# Patient Record
Sex: Female | Born: 1950 | Race: White | Hispanic: No | Marital: Married | Smoking: Never smoker
Health system: Southern US, Community
[De-identification: ages and names within clinical notes are randomized; demographics above are authoritative.]

## PROBLEM LIST (undated history)

## (undated) DIAGNOSIS — I10 Essential (primary) hypertension: Secondary | ICD-10-CM

## (undated) DIAGNOSIS — H3530 Unspecified macular degeneration: Secondary | ICD-10-CM

## (undated) DIAGNOSIS — Z789 Other specified health status: Secondary | ICD-10-CM

## (undated) HISTORY — DX: Unspecified macular degeneration: H35.30

---

## 1993-10-14 HISTORY — PX: ABDOMINAL HYSTERECTOMY: SHX81

## 1998-06-12 ENCOUNTER — Other Ambulatory Visit: Admission: RE | Admit: 1998-06-12 | Discharge: 1998-06-12 | Payer: Self-pay | Admitting: Obstetrics and Gynecology

## 1998-07-28 ENCOUNTER — Ambulatory Visit (HOSPITAL_COMMUNITY): Admission: RE | Admit: 1998-07-28 | Discharge: 1998-07-28 | Payer: Self-pay | Admitting: Gastroenterology

## 1999-07-02 ENCOUNTER — Other Ambulatory Visit: Admission: RE | Admit: 1999-07-02 | Discharge: 1999-07-02 | Payer: Self-pay | Admitting: Obstetrics and Gynecology

## 2000-07-07 ENCOUNTER — Other Ambulatory Visit: Admission: RE | Admit: 2000-07-07 | Discharge: 2000-07-07 | Payer: Self-pay | Admitting: Obstetrics and Gynecology

## 2000-07-08 ENCOUNTER — Encounter: Payer: Self-pay | Admitting: Obstetrics and Gynecology

## 2000-07-08 ENCOUNTER — Encounter: Admission: RE | Admit: 2000-07-08 | Discharge: 2000-07-08 | Payer: Self-pay | Admitting: Obstetrics and Gynecology

## 2001-08-20 ENCOUNTER — Encounter: Admission: RE | Admit: 2001-08-20 | Discharge: 2001-08-20 | Payer: Self-pay | Admitting: Obstetrics and Gynecology

## 2001-08-20 ENCOUNTER — Encounter: Payer: Self-pay | Admitting: Obstetrics and Gynecology

## 2002-12-03 ENCOUNTER — Ambulatory Visit (HOSPITAL_COMMUNITY): Admission: RE | Admit: 2002-12-03 | Discharge: 2002-12-03 | Payer: Self-pay | Admitting: Family Medicine

## 2002-12-03 ENCOUNTER — Encounter: Payer: Self-pay | Admitting: Family Medicine

## 2004-05-29 ENCOUNTER — Emergency Department (HOSPITAL_COMMUNITY): Admission: EM | Admit: 2004-05-29 | Discharge: 2004-05-29 | Payer: Self-pay | Admitting: *Deleted

## 2004-07-16 ENCOUNTER — Encounter (HOSPITAL_COMMUNITY): Admission: RE | Admit: 2004-07-16 | Discharge: 2004-08-15 | Payer: Self-pay | Admitting: Orthopaedic Surgery

## 2004-09-22 ENCOUNTER — Emergency Department (HOSPITAL_COMMUNITY): Admission: EM | Admit: 2004-09-22 | Discharge: 2004-09-22 | Payer: Self-pay | Admitting: Emergency Medicine

## 2010-08-23 ENCOUNTER — Ambulatory Visit (HOSPITAL_COMMUNITY): Admission: RE | Admit: 2010-08-23 | Discharge: 2010-08-23 | Payer: Self-pay | Admitting: Family Medicine

## 2012-04-27 ENCOUNTER — Other Ambulatory Visit (HOSPITAL_COMMUNITY): Payer: Self-pay | Admitting: Family Medicine

## 2012-04-27 DIAGNOSIS — Z139 Encounter for screening, unspecified: Secondary | ICD-10-CM

## 2012-05-01 ENCOUNTER — Ambulatory Visit (HOSPITAL_COMMUNITY)
Admission: RE | Admit: 2012-05-01 | Discharge: 2012-05-01 | Disposition: A | Discharge status: Home / Self Care | Payer: 59 | Source: Ambulatory Visit | Attending: Family Medicine | Admitting: Family Medicine

## 2012-05-01 DIAGNOSIS — Z1231 Encounter for screening mammogram for malignant neoplasm of breast: Secondary | ICD-10-CM | POA: Insufficient documentation

## 2012-05-01 DIAGNOSIS — Z139 Encounter for screening, unspecified: Secondary | ICD-10-CM

## 2013-07-01 ENCOUNTER — Other Ambulatory Visit (HOSPITAL_COMMUNITY): Payer: Self-pay | Admitting: Family Medicine

## 2013-07-01 DIAGNOSIS — Z139 Encounter for screening, unspecified: Secondary | ICD-10-CM

## 2013-07-06 ENCOUNTER — Ambulatory Visit (HOSPITAL_COMMUNITY)
Admission: RE | Admit: 2013-07-06 | Discharge: 2013-07-06 | Disposition: A | Discharge status: Home / Self Care | Payer: 59 | Source: Ambulatory Visit | Attending: Family Medicine | Admitting: Family Medicine

## 2013-07-06 DIAGNOSIS — Z1231 Encounter for screening mammogram for malignant neoplasm of breast: Secondary | ICD-10-CM | POA: Insufficient documentation

## 2013-07-06 DIAGNOSIS — Z139 Encounter for screening, unspecified: Secondary | ICD-10-CM

## 2013-07-08 ENCOUNTER — Encounter (HOSPITAL_COMMUNITY): Payer: Self-pay | Admitting: Pharmacy Technician

## 2013-07-08 NOTE — H&P (Signed)
  NTS SOAP Note  Vital Signs:  Vitals as of: 07/08/2013: Systolic 165: Diastolic 100: Heart Rate 88: Temp 98.57F: Height 16ft 3in: Weight 170Lbs 0 Ounces: BMI 30.11  BMI : 30.11 kg/m2  Subjective: This 62 Years 43 Months old Female presents forSymptoms screening TCS.  Never has had a TCS.  No gi complaints.  No family h/o colon cancer. PROBLEM *  Review of Symptoms:  Constitutional:unremarkable  negativeROSNEGGENROSGener... Head:unremarkablenegative   Head normal Eyes:unremarkable  negativeROSNEGEYESROSEyes... Nose/Mouth/Throat:unremarkablenegativeROSNEGNMTROSENMT... Cardiovascular:  unremarkablenegativeROSNEGCARDIO  ROSCVS... Respiratory:unremarkablenegativeROSNEGRESP  ROSResp... Gastrointestinal:  unremarkable  negativeROSNEGGIROSGI... Genitourinary:unremarkable  negativeROSNEGURINE  ROSGU... Musculoskeletal:unremarkablenegativeROSNEGMSK  ROSMusc... Skin:unremarkablenegativeROSNEGSKINROSSkinBr... Breast:Unremarkable  negativeROSNEGBREAST Hematolgic/Lymphatic:unremarkable  negativeROSNEGHEMELYMPH  ROSHemeLy... Allergic/Immunologic:unremarkablenegative  ROSNEGALLERGICROSAllImm...   Past Medical History:  Obtained  Reviewed   Past Medical History  Surgical History: TAH Medical Problems: none Allergies: codeine Medications: none   Social History:ObtainedReviewed  Social History  Preferred Language: English Race:  White Ethnicity: Not Hispanic / Latino Age: 62 Years 6 Months Marital Status:  M Alcohol:  No Recreational drug(s):  No   Smoking Status: Never smoker reviewed on 07/08/2013 Functional Status reviewed on mm/dd/yyyy ------------------------------------------------ Bathing: Normal Cooking: Normal Dressing: Normal Driving: Normal Eating: Normal Managing Meds: Normal Oral Care: Normal Shopping: Normal Toileting: Normal Transferring: Normal Walking: Normal Cognitive Status reviewed on  mm/dd/yyyy ------------------------------------------------ Attention: Normal Decision Making: Normal Language: Normal Memory: Normal Motor: Normal Perception: Normal Problem Solving: Normal Visual and Spatial: Normal   Family History:Obtained  Reviewed  Family Health History Mother, Deceased; Heart attack (myocardial infarction);  Father, Deceased; Heart attack (myocardial infarction);     Objective Information: General:Unremarkable  Well appearing, well nourished in no distress.illGeneral Complex Abnormalities Skin:  Unremarkable   Skin NormalSkin Complex Abnormalities Head:UnremarkableHead normalHead Complex Abnormalities Eyes:Unremarkable  Eyes NormalEyes Complex Abnorma Mouth:Unremarkable  Mouth NormalMouth Complex Abnormalities Throat:Unremarkable  Throat NormalThroat Complex Abnormalities Neck:Unremarkable  Supple without lymphadenopathy. Thyroid Exam NLNeck Complex Abnorma Heart:Unremarkable  RRR, no murmurHeart NormalHeart Complex Abnorm Lungs:  Unremarkable  CTA bilaterally, no wheezes, rhonchi, rales.  Breathing unlabored.Lungs Complex Abnorm Breasts:  Unremarkable  BreastsTannerGirlsBreastBreastsCA Abdomen:UnremarkableSoft, NT/ND, no HSM, no masses.Abdomen NormalAbdomen Complex Abno Back:Unremarkable  Back NormalBack Complex Abnorma GU:Unremarkable  GUMaleTannerBoysGUGUMale Complex AbnorGUFemTannerGirlsGUGUFemCA Rectal:  UnremarkableRectalMaleRectalMale Complex ARectalFemRectalFemCAdeferred to procedure Extremities:Unremarkable  Extremities NormalExtremit Complex Abn Musculoskeletal:Unremarkable  MusculoskeMusculoCA Lymphatics:UnremarkableLymphBriefLymphaticsLymph Complex Abnormalities  Assessment:Need for screening TCS  Diagnosis &amp; Procedure Smart Code   Plan:Scheduled for TCS on 07/20/13.   Patient Education:Alternative treatments to surgery were discussed with patient (and family).   Risks and benefits  of procedure including bleeding and perforation were fully explained to the patient (and family) who gave informed consent. Patient/family questions were addressed.  Follow-up:F/U...1 month 3 months 6 monthsPRNNTS F/U.Marland KitchenMarland KitchenPending Surgery

## 2013-07-20 ENCOUNTER — Encounter (HOSPITAL_COMMUNITY): Payer: Self-pay

## 2013-07-20 ENCOUNTER — Ambulatory Visit (HOSPITAL_COMMUNITY)
Admission: RE | Admit: 2013-07-20 | Discharge: 2013-07-20 | Disposition: A | Discharge status: Home / Self Care | Payer: 59 | Source: Ambulatory Visit | Attending: General Surgery | Admitting: General Surgery

## 2013-07-20 ENCOUNTER — Encounter (HOSPITAL_COMMUNITY)
Admission: RE | Disposition: A | Discharge status: Home / Self Care | Payer: Self-pay | Source: Ambulatory Visit | Attending: General Surgery

## 2013-07-20 DIAGNOSIS — Z1211 Encounter for screening for malignant neoplasm of colon: Secondary | ICD-10-CM | POA: Insufficient documentation

## 2013-07-20 HISTORY — DX: Other specified health status: Z78.9

## 2013-07-20 HISTORY — PX: COLONOSCOPY: SHX5424

## 2013-07-20 SURGERY — COLONOSCOPY
Anesthesia: Moderate Sedation

## 2013-07-20 MED ORDER — MEPERIDINE HCL 50 MG/ML IJ SOLN
INTRAMUSCULAR | Status: DC | PRN
  Administered 2013-07-20: 50 mg via INTRAVENOUS

## 2013-07-20 MED ORDER — MIDAZOLAM HCL 5 MG/5ML IJ SOLN
INTRAMUSCULAR | Status: DC | PRN
  Administered 2013-07-20: 1 mg via INTRAVENOUS
  Administered 2013-07-20: 3 mg via INTRAVENOUS

## 2013-07-20 MED ORDER — MEPERIDINE HCL 50 MG/ML IJ SOLN
INTRAMUSCULAR | Status: AC
  Filled 2013-07-20: qty 1

## 2013-07-20 MED ORDER — STERILE WATER FOR IRRIGATION IR SOLN
Status: DC | PRN
  Administered 2013-07-20: 09:00:00

## 2013-07-20 MED ORDER — MIDAZOLAM HCL 5 MG/5ML IJ SOLN
INTRAMUSCULAR | Status: AC
  Filled 2013-07-20: qty 5

## 2013-07-20 MED ORDER — SODIUM CHLORIDE 0.9 % IV SOLN
INTRAVENOUS | Status: DC
  Administered 2013-07-20: 07:00:00 via INTRAVENOUS

## 2013-07-20 NOTE — Op Note (Addendum)
[facility] [address] [place], [address]   COLONOSCOPY PROCEDURE REPORT  PATIENT: Melissa Rich, Melissa Rich  MR#: [id number] BIRTHDATE: Jan 21, 1951 , 62  yrs. old GENDER: Female ENDOSCOPIST: Franky Macho, MD REFERRED QI:ONGEXBM, Angus PROCEDURE DATE:  07/20/2013 PROCEDURE:   Colonoscopy, screening ASA CLASS:   Class II INDICATIONS:Average risk patient for colon cancer. MEDICATIONS: Demerol 50 mg IV and Versed 4 mg IV  DESCRIPTION OF PROCEDURE:   After the risks benefits and alternatives of the procedure were thoroughly explained, informed consent was obtained.  A digital rectal exam revealed no abnormalities of the rectum.   The EC-3890Li ([id number])  endoscope was introduced through the anus and advanced to the cecum, which was identified by both the appendix and ileocecal valve. No adverse events experienced.   The quality of the prep was adequate, using MoviPrep  The instrument was then slowly withdrawn as the colon was fully examined.      COLON FINDINGS: A normal appearing cecum, ileocecal valve, and appendiceal orifice were identified.  The ascending, hepatic flexure, transverse, splenic flexure, descending, sigmoid colon and rectum appeared unremarkable.  No polyps or cancers were seen. Retroflexed views revealed no abnormalities. The time to cecum=14 minutes 0 seconds.  Withdrawal time=2 minutes 0 seconds.  The scope was withdrawn and the procedure completed. COMPLICATIONS: There were no complications.  ENDOSCOPIC IMPRESSION: Normal colon  RECOMMENDATIONS: Repeat Colonscopy in 10 years.   eSigned:  Franky Macho, MD 07/20/2013 9:02 AM   cc:

## 2013-07-20 NOTE — Interval H&P Note (Signed)
History and Physical Interval Note:  07/20/2013 8:38 AM  Melissa Rich  has presented today for surgery, with the diagnosis of screening colonoscopy  The various methods of treatment have been discussed with the patient and family. After consideration of risks, benefits and other options for treatment, the patient has consented to  Procedure(s): COLONOSCOPY (N/A) as a surgical intervention .  The patient's history has been reviewed, patient examined, no change in status, stable for surgery.  I have reviewed the patient's chart and labs.  Questions were answered to the patient's satisfaction.     Franky Macho A

## 2013-07-21 ENCOUNTER — Encounter (HOSPITAL_COMMUNITY): Payer: Self-pay | Admitting: General Surgery

## 2014-02-21 ENCOUNTER — Other Ambulatory Visit (HOSPITAL_COMMUNITY): Payer: Self-pay | Admitting: Family Medicine

## 2014-02-21 DIAGNOSIS — R1032 Left lower quadrant pain: Secondary | ICD-10-CM

## 2014-02-24 ENCOUNTER — Ambulatory Visit (HOSPITAL_COMMUNITY)
Admission: RE | Admit: 2014-02-24 | Discharge: 2014-02-24 | Disposition: A | Discharge status: Home / Self Care | Payer: 59 | Source: Ambulatory Visit | Attending: Family Medicine | Admitting: Family Medicine

## 2014-02-24 DIAGNOSIS — K7689 Other specified diseases of liver: Secondary | ICD-10-CM | POA: Insufficient documentation

## 2014-02-24 DIAGNOSIS — M47817 Spondylosis without myelopathy or radiculopathy, lumbosacral region: Secondary | ICD-10-CM | POA: Insufficient documentation

## 2014-02-24 DIAGNOSIS — N83209 Unspecified ovarian cyst, unspecified side: Secondary | ICD-10-CM | POA: Insufficient documentation

## 2014-02-24 DIAGNOSIS — R1032 Left lower quadrant pain: Secondary | ICD-10-CM

## 2014-02-24 MED ORDER — IOHEXOL 300 MG/ML  SOLN
100.0000 mL | Freq: Once | INTRAMUSCULAR | Status: AC | PRN
  Administered 2014-02-24: 100 mL via INTRAVENOUS

## 2014-02-28 ENCOUNTER — Other Ambulatory Visit (HOSPITAL_COMMUNITY): Payer: Self-pay | Admitting: Family Medicine

## 2014-02-28 DIAGNOSIS — IMO0002 Reserved for concepts with insufficient information to code with codable children: Secondary | ICD-10-CM

## 2014-02-28 DIAGNOSIS — R229 Localized swelling, mass and lump, unspecified: Principal | ICD-10-CM

## 2014-03-01 ENCOUNTER — Other Ambulatory Visit (HOSPITAL_COMMUNITY): Payer: 59

## 2014-03-01 ENCOUNTER — Ambulatory Visit (HOSPITAL_COMMUNITY)
Admission: RE | Admit: 2014-03-01 | Discharge: 2014-03-01 | Disposition: A | Discharge status: Home / Self Care | Payer: 59 | Source: Ambulatory Visit | Attending: Family Medicine | Admitting: Family Medicine

## 2014-03-01 DIAGNOSIS — IMO0002 Reserved for concepts with insufficient information to code with codable children: Secondary | ICD-10-CM

## 2014-03-01 DIAGNOSIS — R1900 Intra-abdominal and pelvic swelling, mass and lump, unspecified site: Secondary | ICD-10-CM | POA: Insufficient documentation

## 2014-03-01 DIAGNOSIS — R229 Localized swelling, mass and lump, unspecified: Secondary | ICD-10-CM

## 2014-03-23 ENCOUNTER — Encounter: Payer: Self-pay | Admitting: *Deleted

## 2014-03-24 ENCOUNTER — Ambulatory Visit (INDEPENDENT_AMBULATORY_CARE_PROVIDER_SITE_OTHER): Payer: 59 | Admitting: Obstetrics & Gynecology

## 2014-03-24 ENCOUNTER — Encounter: Payer: Self-pay | Admitting: Obstetrics & Gynecology

## 2014-03-24 VITALS — BP 160/100 | Ht 63.0 in | Wt 174.0 lb

## 2014-03-24 DIAGNOSIS — N83209 Unspecified ovarian cyst, unspecified side: Secondary | ICD-10-CM

## 2014-03-24 NOTE — Progress Notes (Signed)
Patient ID: Melissa Rich, female   DOB: 1951/02/25, 63 y.o.   MRN: [id number] Assessment:Plan Right ovarian vs extra ovarian simple cyst, 4.2 cm, newly diagnosed:   Will check ca 125 today and  if normal see back in 6 months for repeat sonogram here in the office Pt is a self described "worry wart" and is instructed removal is reasonable but more than likely not related, to her pain symptoms If becomes more concerned she can consider laparoscopic removal  HPI  Pt with episodic left and right lower quadrants since about November, she relates it to a work episode of pulling a hand truck Feels like she sort of pulled something then, started having some discomfort after that No bleeding No BM or GI complaints Had hysterectomy 1995 for fibroids Dr Laureen Ochs  Seen by Dr Megan Mans and had CT scan and sonogram performed which reveals a 4.2 cm simple right sided, ovarian vs extra ovarian, cyst I reviewed the images myself  ROS No burning with urination, frequency or urgency No nausea, vomiting or diarrhea Nor fever chills or other constitutional symptoms  Exam Abdomen soft non tender not distended no appreciable pain NEFG Vagina pink no discharge Cuff intact no midline or adnexal masses palpable Non tender adnexa   Review of Systems  Constitutional: Negative for fever, chills, weight loss, malaise/fatigue and diaphoresis.  HENT: Negative for hearing loss, ear pain, nosebleeds, congestion, sore throat, neck pain, tinnitus and ear discharge.   Eyes: Negative for blurred vision, double vision, photophobia, pain, discharge and redness.  Respiratory: Negative for cough, hemoptysis, sputum production, shortness of breath, wheezing and stridor.   Cardiovascular: Negative for chest pain, palpitations, orthopnea, claudication, leg swelling and PND.  Gastrointestinal: Positive for abdominal pain. Negative for heartburn, nausea, vomiting, diarrhea, constipation, blood in stool and melena.   Genitourinary: Negative for dysuria, urgency, frequency, hematuria and flank pain.  Musculoskeletal: Negative for myalgias, back pain, joint pain and falls.  Skin: Negative for itching and rash.  Neurological: Negative for dizziness, tingling, tremors, sensory change, speech change, focal weakness, seizures, loss of consciousness, weakness and headaches.  Endo/Heme/Allergies: Negative for environmental allergies and polydipsia. Does not bruise/bleed easily.  Psychiatric/Behavioral: Negative for depression, suicidal ideas, hallucinations, memory loss and substance abuse. The patient is not nervous/anxious and does not have insomnia.

## 2014-03-25 LAB — CA 125: CA 125: 8.3 U/mL (ref 0.0–30.2)

## 2014-03-31 ENCOUNTER — Encounter: Payer: Self-pay | Admitting: Family Medicine

## 2014-08-16 ENCOUNTER — Other Ambulatory Visit (HOSPITAL_COMMUNITY): Payer: Self-pay | Admitting: Family Medicine

## 2014-08-16 DIAGNOSIS — Z1231 Encounter for screening mammogram for malignant neoplasm of breast: Secondary | ICD-10-CM

## 2014-08-24 ENCOUNTER — Ambulatory Visit (HOSPITAL_COMMUNITY)
Admission: RE | Admit: 2014-08-24 | Discharge: 2014-08-24 | Disposition: A | Discharge status: Home / Self Care | Payer: 59 | Source: Ambulatory Visit | Attending: Family Medicine | Admitting: Family Medicine

## 2014-08-24 DIAGNOSIS — Z1231 Encounter for screening mammogram for malignant neoplasm of breast: Secondary | ICD-10-CM | POA: Insufficient documentation

## 2014-09-23 ENCOUNTER — Ambulatory Visit: Payer: 59 | Admitting: Obstetrics & Gynecology

## 2014-09-27 ENCOUNTER — Ambulatory Visit (INDEPENDENT_AMBULATORY_CARE_PROVIDER_SITE_OTHER): Payer: 59

## 2014-09-27 ENCOUNTER — Other Ambulatory Visit: Payer: Self-pay | Admitting: Obstetrics & Gynecology

## 2014-09-27 ENCOUNTER — Ambulatory Visit (INDEPENDENT_AMBULATORY_CARE_PROVIDER_SITE_OTHER): Payer: 59 | Admitting: Obstetrics & Gynecology

## 2014-09-27 ENCOUNTER — Encounter: Payer: Self-pay | Admitting: Obstetrics & Gynecology

## 2014-09-27 VITALS — BP 132/80 | Wt 173.0 lb

## 2014-09-27 DIAGNOSIS — N8320 Unspecified ovarian cysts: Secondary | ICD-10-CM

## 2014-09-27 DIAGNOSIS — N83209 Unspecified ovarian cyst, unspecified side: Secondary | ICD-10-CM

## 2014-09-27 DIAGNOSIS — N83201 Unspecified ovarian cyst, right side: Secondary | ICD-10-CM

## 2014-09-27 NOTE — Progress Notes (Signed)
Patient ID: Melissa Rich, female   DOB: 1951-08-31, 63 y.o.   MRN: [id number] [place] Pelvis Limited  09/27/2014   GYNECOLOGIC SONOGRAM   Melissa Rich is a 63 y.o.  for a pelvic sonogram for follow up Rt cyst  (4.2cm). Pt is s/p hysterectomy.  Uterus                    Surgically Absent Vaginal Cuff- appears WNL  Right adnexa             4.6  x 3.3 cm cystic mass remains slight increase  in size since previous u/s in May 2015 (Seen best trans-abdominally  slightly to the Rt of ML)  Left ovary                2.8 x 1.8 x 1.7 cm,   No free fluid noted within the pelvis  Pt noted tenderness bilaterally during exam  Technician Comments:  Vaginal Cuff appears WNL, Rt adnexal cyst remains slight increase in size  since previous u/s, Lt ovary appears WNL, no free fluid noted within the  pelvis, Pt noted tenderness bilaterally during exam.   Chari ManningMcBride, Tasha 09/27/2014 9:27 AM  Clinical Impression and recommendations:  I have reviewed the sonogram results above, combined with the patient's  current clinical course, below are my impressions and any appropriate  recommendations for management based on the sonographic findings.  Persistent stable right adnexal/ovarian cyst, compared to study in 03/2014 No other significant findings   Kaeson Kleinert H 09/27/2014 9:53 AM     Stable right adnexal cystic mass, asymptomatic CA 125 back in June was normal Continue to follow now on yearly basis since stable for 6 months  Follow up if needed prior  Face to face time 15 minutes Greater than 50% time spent counselling patient regarding her sonogram results

## 2015-04-20 ENCOUNTER — Other Ambulatory Visit (HOSPITAL_COMMUNITY)
Admission: RE | Admit: 2015-04-20 | Discharge: 2015-04-20 | Disposition: A | Discharge status: Home / Self Care | Payer: 59 | Source: Ambulatory Visit | Attending: Family Medicine | Admitting: Family Medicine

## 2015-04-20 ENCOUNTER — Other Ambulatory Visit (HOSPITAL_COMMUNITY): Payer: Self-pay | Admitting: Family Medicine

## 2015-04-20 DIAGNOSIS — R1032 Left lower quadrant pain: Secondary | ICD-10-CM | POA: Diagnosis not present

## 2015-04-20 LAB — BASIC METABOLIC PANEL
Anion gap: 4 — ABNORMAL LOW (ref 5–15)
BUN: 16 mg/dL (ref 6–20)
CALCIUM: 8.9 mg/dL (ref 8.9–10.3)
CO2: 30 mmol/L (ref 22–32)
Chloride: 107 mmol/L (ref 101–111)
Creatinine, Ser: 0.69 mg/dL (ref 0.44–1.00)
GFR calc Af Amer: >60 mL/min (ref >60)
GFR calc non Af Amer: >60 mL/min (ref >60)
GLUCOSE: 96 mg/dL (ref 65–99)
Potassium: 4.4 mmol/L (ref 3.5–5.1)
SODIUM: 141 mmol/L (ref 135–145)

## 2015-04-20 LAB — CBC WITH DIFFERENTIAL/PLATELET
Basophils Absolute: 0 10*3/uL (ref 0.0–0.1)
Basophils Relative: 1 % (ref 0–1)
Eosinophils Absolute: 0.1 10*3/uL (ref 0.0–0.7)
Eosinophils Relative: 1 % (ref 0–5)
HEMATOCRIT: 40.5 % (ref 36.0–46.0)
HEMOGLOBIN: 13.2 g/dL (ref 12.0–15.0)
LYMPHS PCT: 22 % (ref 12–46)
Lymphs Abs: 1 10*3/uL (ref 0.7–4.0)
MCH: 30 pg (ref 26.0–34.0)
MCHC: 32.6 g/dL (ref 30.0–36.0)
MCV: 92 fL (ref 78.0–100.0)
MONOS PCT: 10 % (ref 3–12)
Monocytes Absolute: 0.5 10*3/uL (ref 0.1–1.0)
NEUTROS ABS: 2.9 10*3/uL (ref 1.7–7.7)
NEUTROS PCT: 66 % (ref 43–77)
Platelets: 295 10*3/uL (ref 150–400)
RBC: 4.4 MIL/uL (ref 3.87–5.11)
RDW: 12.7 % (ref 11.5–15.5)
WBC: 4.4 10*3/uL (ref 4.0–10.5)

## 2015-04-27 ENCOUNTER — Ambulatory Visit (HOSPITAL_COMMUNITY)
Admission: RE | Admit: 2015-04-27 | Discharge: 2015-04-27 | Disposition: A | Discharge status: Home / Self Care | Payer: 59 | Source: Ambulatory Visit | Attending: Family Medicine | Admitting: Family Medicine

## 2015-04-27 ENCOUNTER — Encounter (HOSPITAL_COMMUNITY): Payer: Self-pay

## 2015-04-27 DIAGNOSIS — R1032 Left lower quadrant pain: Secondary | ICD-10-CM | POA: Insufficient documentation

## 2015-04-27 MED ORDER — IOHEXOL 300 MG/ML  SOLN
100.0000 mL | Freq: Once | INTRAMUSCULAR | Status: AC | PRN
  Administered 2015-04-27: 100 mL via INTRAVENOUS

## 2015-07-07 IMAGING — CT CT ABD-PELV W/ CM
2 of 4 series · 16 of 46 positions shown, 18 images · IV contrast (Omnipaque 300)
Comparison: None.

CLINICAL DATA: prior hysterectomy, LLQ abdominal pain moderate

EXAM:
CT ABDOMEN AND PELVIS WITH CONTRAST
TECHNIQUE: Multidetector CT imaging of the abdomen and pelvis was performed
using the standard protocol following bolus administration of
intravenous contrast.
CONTRAST:  100mL OMNIPAQUE IOHEXOL 300 MG/ML  SOLN

[Series 2: abd_pel_with 5.0 b40f · axial · 0.68mm/px · z∈[-588,-198]mm · 13 of 86 slices shown, 15 images]
[im 4/86  soft-tissue]
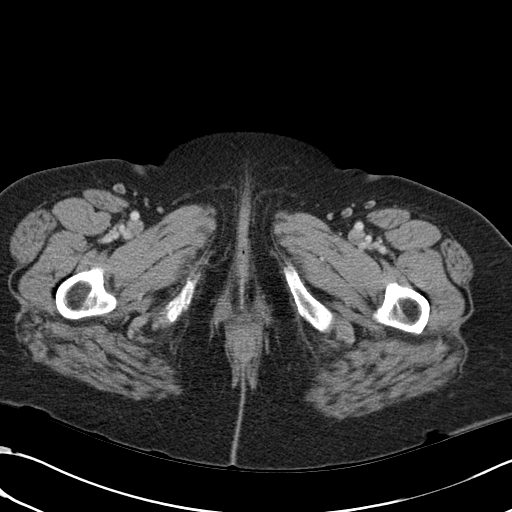
[im 4/86  bone]
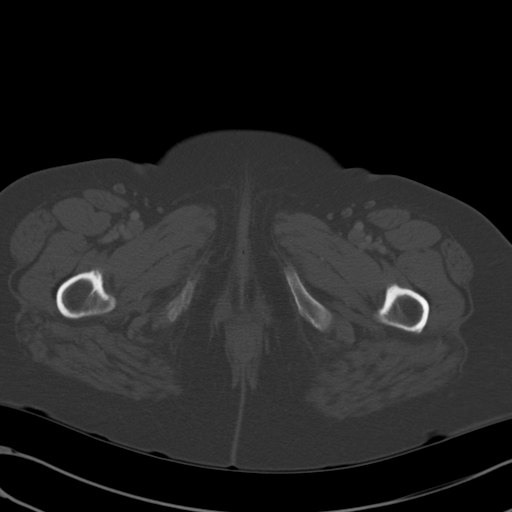
[im 12/86  soft-tissue]
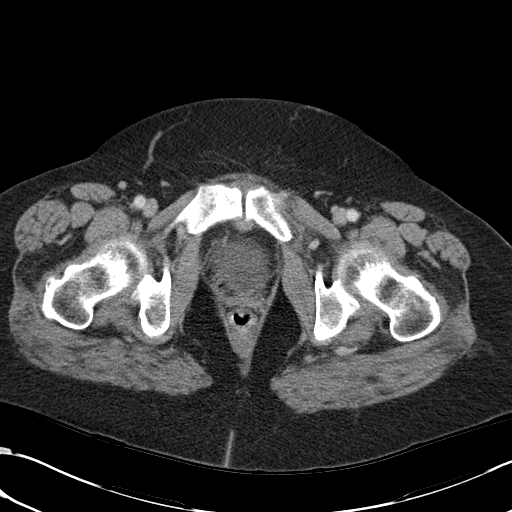
[im 20/86  soft-tissue]
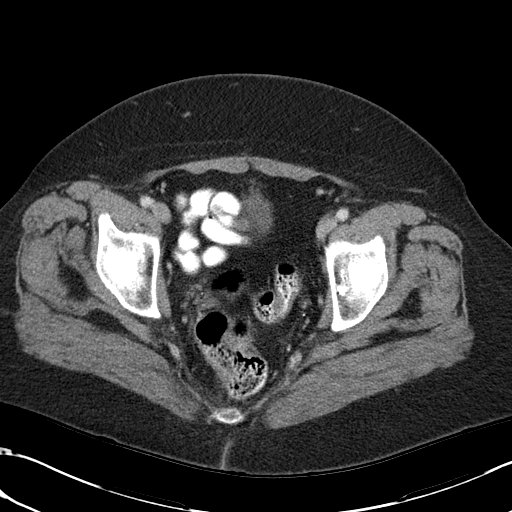
[im 24/86  soft-tissue]
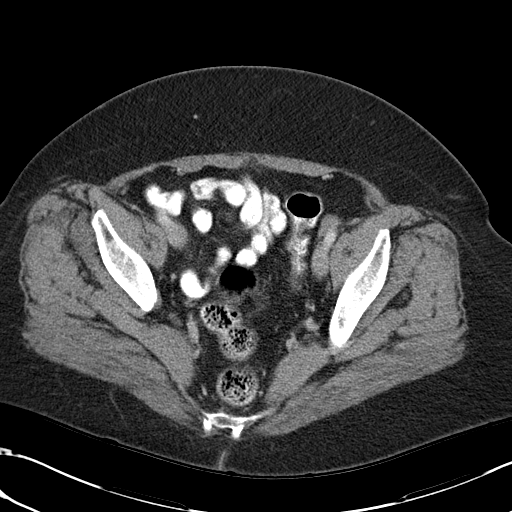
[im 31/86  soft-tissue]
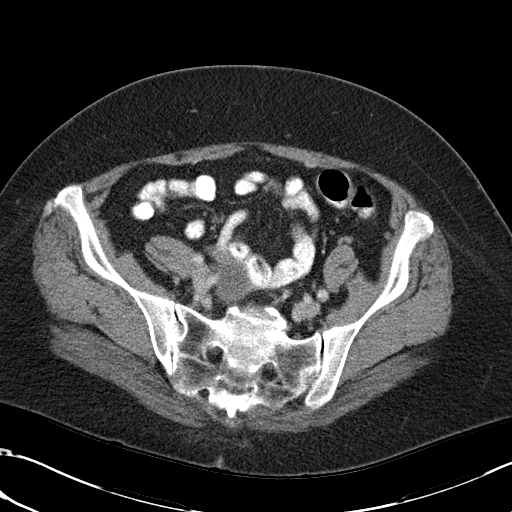
[im 35/86  soft-tissue]
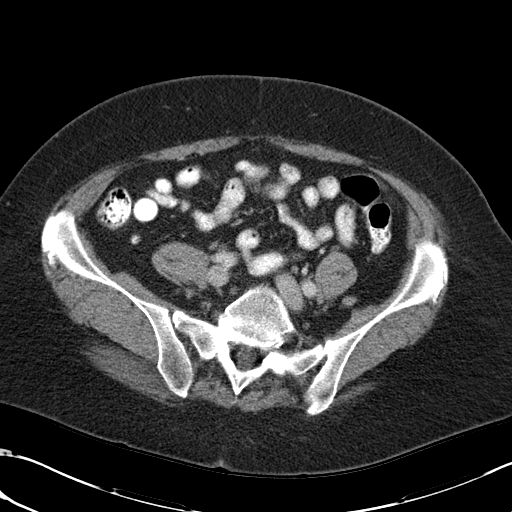
[im 43/86  soft-tissue]
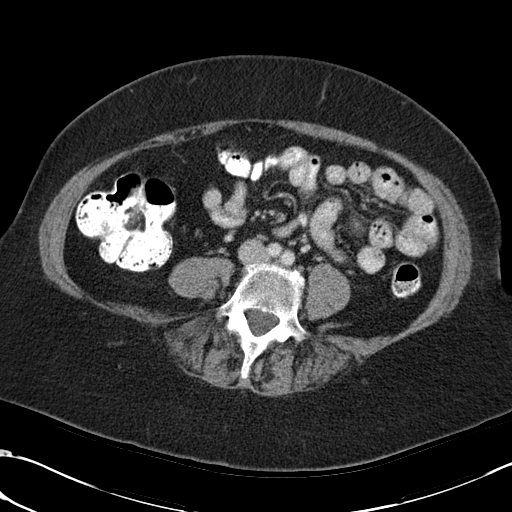
[im 51/86  soft-tissue]
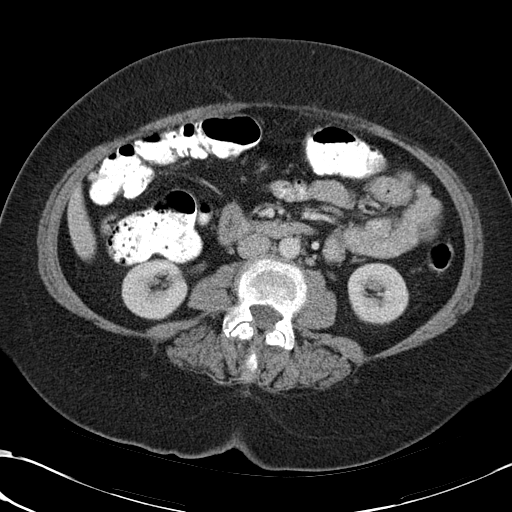
[im 55/86  soft-tissue]
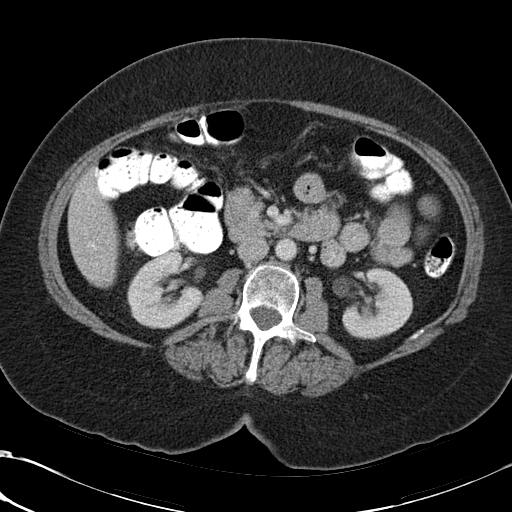
[im 55/86  bone]
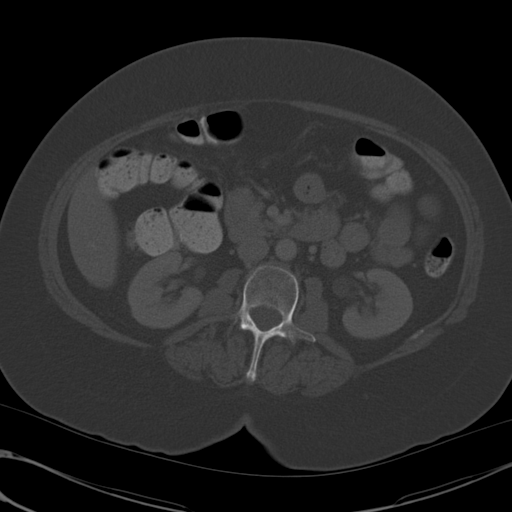
[im 62/86  soft-tissue]
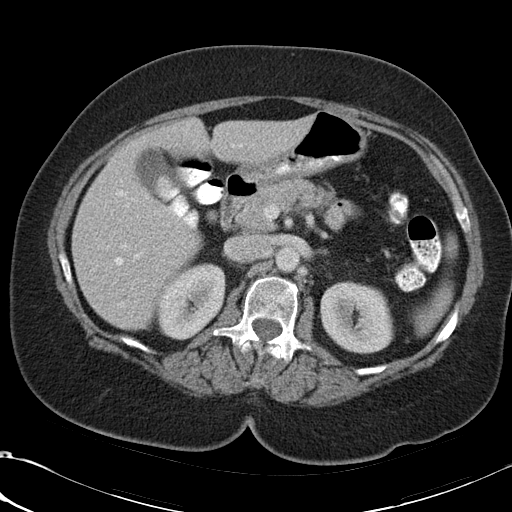
[im 66/86  soft-tissue]
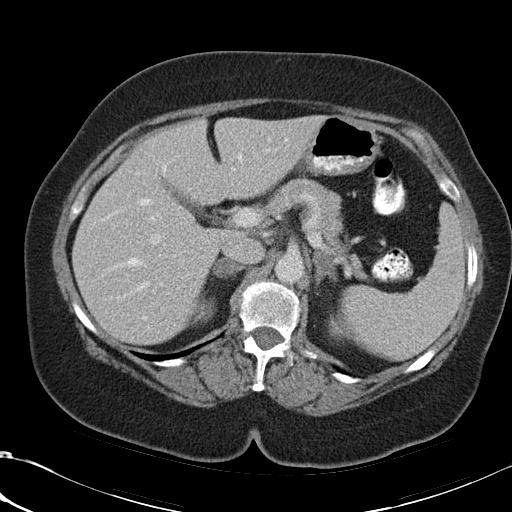
[im 74/86  soft-tissue]
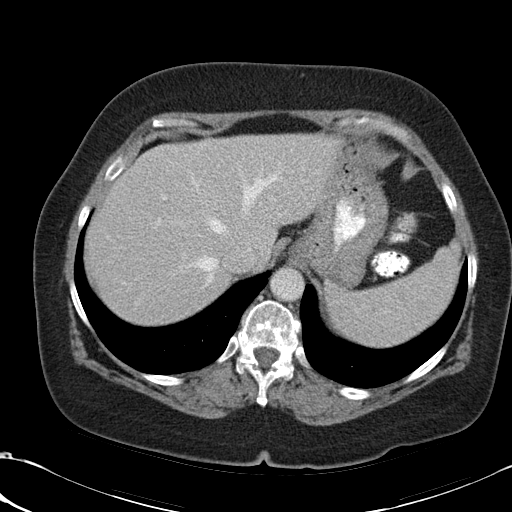
[im 82/86  soft-tissue]
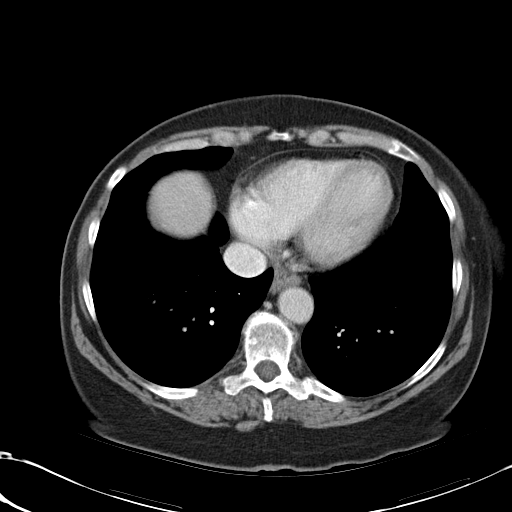

[Series 4: abd_pel_with 3.0 spo · coronal · 0.69mm/px · 3 of 88 slices shown]
[im 30/88  soft-tissue]
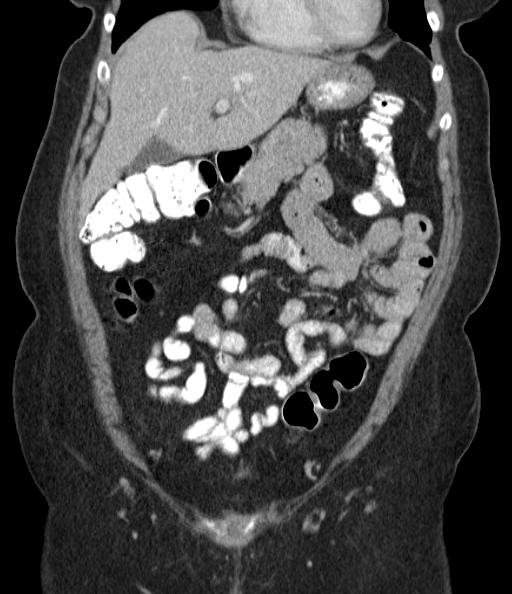
[im 39/88  soft-tissue]
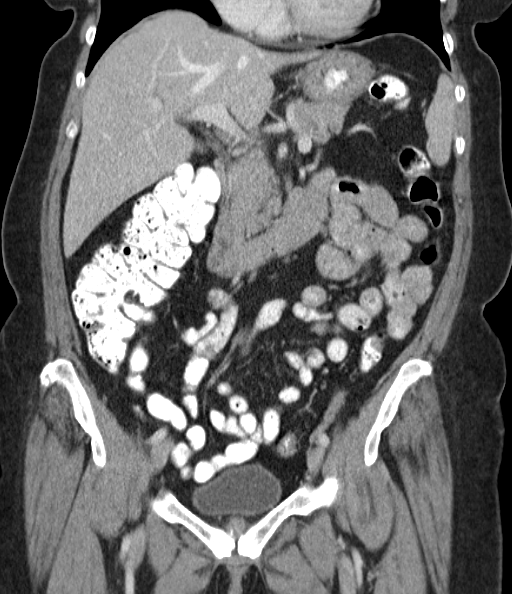
[im 49/88  soft-tissue]
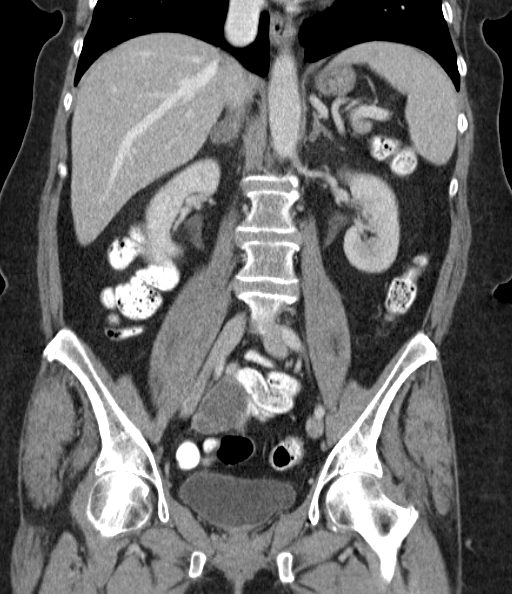

[16 of 46 positions shown; findings below may reference images not displayed]

FINDINGS: The lung bases are clear.

The spleen, pancreas, and kidneys are unremarkable. Small benign
appearing cyst within the dome of the liver, the liver is otherwise
unremarkable. The gallbladder fossa is unremarkable.

A 1.2 cm right adrenal nodule and a 2.3 cm left adrenal nodule
appreciated statistically likely representing adrenal adenomas.
Further characterization with adrenal protocol MRI is recommended.

No wall abdominal masses, adenopathy, free fluid, nor loculated
fluid collections.

No abdominal aortic aneurysm. The celiac, SMA, IMA, portal vein, SMV
are opacified.

No abdominal wall nor inguinal hernia.

The bowel is negative.  The appendix areas appreciated and negative.

A 4 cm right adnexal cystic mass likely an ovarian cyst. Correlation
with oophorectomy is recommended. Patient's history is significant
for prior hysterectomy. Characterization of this finding with pelvic
ultrasound is also recommended. No further pelvic masses, free
fluid, loculated fluid collects nor adenopathy.

Areas of spondylosis within the lumbar spine no aggressive appearing
osseous lesions.
IMPRESSION: 4 cm right adnexal cystic mass. Finding is likely an ovarian cyst.
Further characterization with pelvic ultrasound is recommended and
correlation with patient's GYN surgical history.

Indeterminate bilateral adrenal nodules further evaluation with
adrenal protocol MRI recommended.

Small 3-5 cm sized benign appearing cysts within the liver.

No further evidence of abdominal or pelvic pathology.

## 2015-08-28 ENCOUNTER — Other Ambulatory Visit (HOSPITAL_COMMUNITY): Payer: Self-pay | Admitting: Family Medicine

## 2015-08-28 DIAGNOSIS — Z1231 Encounter for screening mammogram for malignant neoplasm of breast: Secondary | ICD-10-CM

## 2015-08-31 ENCOUNTER — Ambulatory Visit (HOSPITAL_COMMUNITY)
Admission: RE | Admit: 2015-08-31 | Discharge: 2015-08-31 | Disposition: A | Discharge status: Home / Self Care | Payer: 59 | Source: Ambulatory Visit | Attending: Family Medicine | Admitting: Family Medicine

## 2015-08-31 DIAGNOSIS — Z1231 Encounter for screening mammogram for malignant neoplasm of breast: Secondary | ICD-10-CM | POA: Insufficient documentation

## 2015-09-29 ENCOUNTER — Other Ambulatory Visit: Payer: Self-pay | Admitting: Obstetrics & Gynecology

## 2015-09-29 DIAGNOSIS — N83201 Unspecified ovarian cyst, right side: Secondary | ICD-10-CM

## 2015-10-02 ENCOUNTER — Ambulatory Visit (INDEPENDENT_AMBULATORY_CARE_PROVIDER_SITE_OTHER): Payer: Managed Care, Other (non HMO)

## 2015-10-02 ENCOUNTER — Ambulatory Visit (INDEPENDENT_AMBULATORY_CARE_PROVIDER_SITE_OTHER): Payer: Managed Care, Other (non HMO) | Admitting: Obstetrics & Gynecology

## 2015-10-02 ENCOUNTER — Encounter: Payer: Self-pay | Admitting: Obstetrics & Gynecology

## 2015-10-02 VITALS — BP 140/80 | HR 80 | Wt 177.3 lb

## 2015-10-02 DIAGNOSIS — N83201 Unspecified ovarian cyst, right side: Secondary | ICD-10-CM | POA: Diagnosis not present

## 2015-10-02 NOTE — Progress Notes (Signed)
Patient ID: Melissa Rich, female   DOB: [birth year], 64 y.o.   MRN: [id number]     Follow up appointment for results  Chief Complaint  Patient presents with  . Follow-up    ultrasound today.    Blood pressure 140/80, pulse 80, weight 177 lb 4.8 oz (80.423 kg).  US Transvaginal Non-ob  10/02/2015  GYNECOLOGIC SONOGRAM Melissa Rich is a 64 y.o.s/p hysterectomy, she is here for a pelvic sonogram to f/u rt adnexal cyst. Vag cuff                   wnl Endometrium         n/a Right ovary              simple right adnexal cyst 4.3 x 3.7 x 3.9cm n/c Left ovary                1.8 x 1.3 x 1.9 cm, wnl limited view Technician Comments: PELVIC US TA/TV:  Vag cuff wnl, simple right adnexal cyst 4.3 x 3.7 x 3.9cm (art and venous flow seen) limited view of lt ov (wnl),no free fluid, rt adnexal pain during ultrasound [employer] 10/02/2015 1:54 PM Clinical Impression and recommendations: I have reviewed the sonogram results above, combined with the patient's current clinical course, below are my impressions and any appropriate recommendations for management based on the sonographic findings. Stable simple cyst right ovary, no changes normal CA 125 6 months ago Left ovary normal Uterus absent No free fluid Melissa Rich 10/02/2015 2:14 PM   US Pelvis Complete  10/02/2015  GYNECOLOGIC SONOGRAM Melissa Rich is a 64 y.o.s/p hysterectomy, she is here for a pelvic sonogram to f/u rt adnexal cyst. Vag cuff                   wnl Endometrium         n/a Right ovary              simple right adnexal cyst 4.3 x 3.7 x 3.9cm n/c Left ovary                1.8 x 1.3 x 1.9 cm, wnl limited view Technician Comments: PELVIC US TA/TV:  Vag cuff wnl, simple right adnexal cyst 4.3 x 3.7 x 3.9cm (art and venous flow seen) limited view of lt ov (wnl),no free fluid, rt adnexal pain during ultrasound [employer] 10/02/2015 1:54 PM Clinical Impression and recommendations: I have reviewed the sonogram results above, combined with  the patient's current clinical course, below are my impressions and any appropriate recommendations for management based on the sonographic findings. Stable simple cyst right ovary, no changes normal CA 125 6 months ago Left ovary normal Uterus absent No free fluid Melissa Rich Rich 10/02/2015 2:14 PM    assymptomatic CA 125 normal  MEDS ordered this encounter: No orders of the defined types were placed in this encounter.    Orders for this encounter: No orders of the defined types were placed in this encounter.    Plan: Repeat sonogram 1 year or prn if symptoms arise  Follow Up: 1 year sonogram + visit    Face to face time:  10 minutes  Greater than 50% of the visit time was spent in counseling and coordination of care with the patient.  The summary and outline of the counseling and care coordination is summarized in the note above.   All questions were answered.  Past Medical History  Diagnosis Date  . Medical history non-contributory     Past Surgical History  Procedure Laterality Date  . Abdominal hysterectomy  1995  . Colonoscopy N/A 07/20/2013    Procedure: COLONOSCOPY;  Surgeon: Dalia HeadingMark A Jenkins, MD;  Location: [facility];  Service: Gastroenterology;  Laterality: N/A;    OB History    No data available      Allergies  Allergen Reactions  . Codeine Swelling    Ankle Area    Social History   Social History  . Marital Status: Married    Spouse Name: N/A  . Number of Children: N/A  . Years of Education: N/A   Social History Main Topics  . Smoking status: Never Smoker   . Smokeless tobacco: Never Used  . Alcohol Use: No  . Drug Use: No  . Sexual Activity: Not Asked   Other Topics Concern  . None   Social History Narrative    Family History  Problem Relation Age of Onset  . Heart attack Mother   . Heart attack Father   . Cancer Other   . Heart disease Other

## 2015-10-02 NOTE — Progress Notes (Signed)
PELVIC US TA/TV:  Vag cuff wnl, simple right adnexal cyst 4.3 x 3.7 x 3.9cm (art and venous flow seen) limited view of lt ov (wnl),no free fluid, rt adnexal pain during ultrasound

## 2015-11-08 ENCOUNTER — Ambulatory Visit (HOSPITAL_COMMUNITY)
Admission: RE | Admit: 2015-11-08 | Discharge: 2015-11-08 | Disposition: A | Discharge status: Home / Self Care | Payer: Managed Care, Other (non HMO) | Source: Ambulatory Visit | Attending: Internal Medicine | Admitting: Internal Medicine

## 2015-11-08 ENCOUNTER — Other Ambulatory Visit (HOSPITAL_COMMUNITY): Payer: Self-pay | Admitting: Internal Medicine

## 2015-11-08 DIAGNOSIS — R059 Cough, unspecified: Secondary | ICD-10-CM

## 2015-11-08 DIAGNOSIS — R05 Cough: Secondary | ICD-10-CM | POA: Diagnosis present

## 2016-08-21 ENCOUNTER — Other Ambulatory Visit (HOSPITAL_COMMUNITY): Payer: Self-pay | Admitting: Internal Medicine

## 2016-08-21 DIAGNOSIS — Z1231 Encounter for screening mammogram for malignant neoplasm of breast: Secondary | ICD-10-CM

## 2016-09-04 ENCOUNTER — Ambulatory Visit (HOSPITAL_COMMUNITY)
Admission: RE | Admit: 2016-09-04 | Discharge: 2016-09-04 | Disposition: A | Discharge status: Home / Self Care | Payer: Managed Care, Other (non HMO) | Source: Ambulatory Visit | Attending: Internal Medicine | Admitting: Internal Medicine

## 2016-09-04 DIAGNOSIS — Z1231 Encounter for screening mammogram for malignant neoplasm of breast: Secondary | ICD-10-CM

## 2016-10-10 ENCOUNTER — Encounter: Payer: Self-pay | Admitting: Obstetrics & Gynecology

## 2016-10-10 ENCOUNTER — Encounter (INDEPENDENT_AMBULATORY_CARE_PROVIDER_SITE_OTHER): Payer: Self-pay

## 2016-10-10 ENCOUNTER — Ambulatory Visit (INDEPENDENT_AMBULATORY_CARE_PROVIDER_SITE_OTHER): Payer: Managed Care, Other (non HMO) | Admitting: Obstetrics & Gynecology

## 2016-10-10 VITALS — BP 186/88 | HR 78 | Ht 63.0 in | Wt 178.0 lb

## 2016-10-10 DIAGNOSIS — Z1212 Encounter for screening for malignant neoplasm of rectum: Secondary | ICD-10-CM

## 2016-10-10 DIAGNOSIS — Z01411 Encounter for gynecological examination (general) (routine) with abnormal findings: Secondary | ICD-10-CM | POA: Diagnosis not present

## 2016-10-10 DIAGNOSIS — N83209 Unspecified ovarian cyst, unspecified side: Secondary | ICD-10-CM | POA: Diagnosis not present

## 2016-10-10 DIAGNOSIS — Z1211 Encounter for screening for malignant neoplasm of colon: Secondary | ICD-10-CM

## 2016-10-10 DIAGNOSIS — R030 Elevated blood-pressure reading, without diagnosis of hypertension: Secondary | ICD-10-CM | POA: Diagnosis not present

## 2016-10-10 DIAGNOSIS — Z01419 Encounter for gynecological examination (general) (routine) without abnormal findings: Secondary | ICD-10-CM

## 2016-10-10 MED ORDER — LOSARTAN POTASSIUM-HCTZ 50-12.5 MG PO TABS: 1.0000 | ORAL_TABLET | Freq: Every day | ORAL | 1 refills | Status: DC

## 2016-10-10 NOTE — Progress Notes (Signed)
Subjective:     Melissa Rich is a [age] y[age] Rich.o. female here for a routine exam.  No LMP recorded. Patient has had a hysterectomy. No obstetric history on file. Birth Control Method:  hysterectomy Menstrual Calendar(currently): amenorrheic  Current complaints: none.   Current acute medical issues:  Untreated hypertension   Recent Gynecologic History No LMP recorded. Patient has had a hysterectomy. Last Pap: na,   Last mammogram: 09/04/2016,  normal  Past Medical History:  Diagnosis Date  . Medical history non-contributory     Past Surgical History:  Procedure Laterality Date  . ABDOMINAL HYSTERECTOMY  1995  . COLONOSCOPY N/A 07/20/2013   Procedure: COLONOSCOPY;  Surgeon: Dalia HeadingMark A Jenkins, MD;  Location: [facility];  Service: Gastroenterology;  Laterality: N/A;    OB History    No data available      Social History   Social History  . Marital status: Married    Spouse name: N/A  . Number of children: N/A  . Years of education: N/A   Social History Main Topics  . Smoking status: Never Smoker  . Smokeless tobacco: Never Used  . Alcohol use No  . Drug use: No  . Sexual activity: Not Asked   Other Topics Concern  . None   Social History Narrative  . None    Family History  Problem Relation Age of Onset  . Heart attack Mother   . Heart attack Father   . Cancer Other   . Heart disease Other      Current Outpatient Prescriptions:  .  naproxen sodium (ALEVE) 220 MG tablet, Take 220 mg by mouth 2 (two) times daily as needed (Pain)., Disp: , Rfl:  .  losartan-hydrochlorothiazide (HYZAAR) 50-12.5 MG tablet, Take 1 tablet by mouth daily., Disp: 30 tablet, Rfl: 1  Review of Systems  Review of Systems  Constitutional: Negative for fever, chills, weight loss, malaise/fatigue and diaphoresis.  HENT: Negative for hearing loss, ear pain, nosebleeds, congestion, sore throat, neck pain, tinnitus and ear discharge.   Eyes: Negative for blurred vision, double vision,  photophobia, pain, discharge and redness.  Respiratory: Negative for cough, hemoptysis, sputum production, shortness of breath, wheezing and stridor.   Cardiovascular: Negative for chest pain, palpitations, orthopnea, claudication, leg swelling and PND.  Gastrointestinal: negative for abdominal pain. Negative for heartburn, nausea, vomiting, diarrhea, constipation, blood in stool and melena.  Genitourinary: Negative for dysuria, urgency, frequency, hematuria and flank pain.  Musculoskeletal: Negative for myalgias, back pain, joint pain and falls.  Skin: Negative for itching and rash.  Neurological: Negative for dizziness, tingling, tremors, sensory change, speech change, focal weakness, seizures, loss of consciousness, weakness and headaches.  Endo/Heme/Allergies: Negative for environmental allergies and polydipsia. Does not bruise/bleed easily.  Psychiatric/Behavioral: Negative for depression, suicidal ideas, hallucinations, memory loss and substance abuse. The patient is not nervous/anxious and does not have insomnia.        Objective:  Blood pressure (!) 186/88, pulse 78, height 5\' 3"  (1.6 m), weight 178 lb (80.7 kg).   Physical Exam  Vitals reviewed. Constitutional: She is oriented to person, place, and time. She appears well-developed and well-nourished.  HENT:  Head: Normocephalic and atraumatic.        Right Ear: External ear normal.  Left Ear: External ear normal.  Nose: Nose normal.  Mouth/Throat: Oropharynx is clear and moist.  Eyes: Conjunctivae and EOM are normal. Pupils are equal, round, and reactive to light. Right eye exhibits no discharge. Left eye exhibits no discharge. No  scleral icterus.  Neck: Normal range of motion. Neck supple. No tracheal deviation present. No thyromegaly present.  Cardiovascular: Normal rate, regular rhythm, normal heart sounds and intact distal pulses.  Exam reveals no gallop and no friction rub.   No murmur heard. Respiratory: Effort normal and  breath sounds normal. No respiratory distress. She has no wheezes. She has no rales. She exhibits no tenderness.  GI: Soft. Bowel sounds are normal. She exhibits no distension and no mass. There is no tenderness. There is no rebound and no guarding.  Genitourinary:  Breasts no masses skin changes or nipple changes bilaterally      Vulva is normal without lesions Vagina is pink moist without discharge Cervix absent Uterus is absent Adnexa is negative with normal sized ovaries  {Rectal    hemoccult negative, normal tone, no masses  Musculoskeletal: Normal range of motion. She exhibits no edema and no tenderness.  Neurological: She is alert and oriented to person, place, and time. She has normal reflexes. She displays normal reflexes. No cranial nerve deficit. She exhibits normal muscle tone. Coordination normal.  Skin: Skin is warm and dry. No rash noted. No erythema. No pallor.  Psychiatric: She has a normal mood and affect. Her behavior is normal. Judgment and thought content normal.       Medications Ordered at today's visit: Meds ordered this encounter  Medications  . losartan-hydrochlorothiazide (HYZAAR) 50-12.5 MG tablet    Sig: Take 1 tablet by mouth daily.    Dispense:  30 tablet    Refill:  1    Other orders placed at today's visit: Orders Placed This Encounter  Procedures  . US Pelvis Complete  . US Transvaginal Non-OB      Assessment:    Healthy female exam.    Plan:    Mammogram ordered. Follow up in: 1 month. sonogram to evaluate ovarian cyst and to check BP     Return in about 1 month (around 11/10/2016) for GYN sono, Follow up, with Dr Despina HiddenEure.

## 2016-11-07 ENCOUNTER — Other Ambulatory Visit: Payer: Managed Care, Other (non HMO)

## 2016-11-11 ENCOUNTER — Encounter: Payer: Self-pay | Admitting: Obstetrics & Gynecology

## 2016-11-11 ENCOUNTER — Ambulatory Visit (INDEPENDENT_AMBULATORY_CARE_PROVIDER_SITE_OTHER): Payer: Managed Care, Other (non HMO)

## 2016-11-11 ENCOUNTER — Ambulatory Visit (INDEPENDENT_AMBULATORY_CARE_PROVIDER_SITE_OTHER): Payer: Managed Care, Other (non HMO) | Admitting: Obstetrics & Gynecology

## 2016-11-11 VITALS — BP 130/80 | HR 76 | Wt 177.4 lb

## 2016-11-11 DIAGNOSIS — N83201 Unspecified ovarian cyst, right side: Secondary | ICD-10-CM

## 2016-11-11 DIAGNOSIS — R102 Pelvic and perineal pain: Secondary | ICD-10-CM | POA: Diagnosis not present

## 2016-11-11 DIAGNOSIS — N83209 Unspecified ovarian cyst, unspecified side: Secondary | ICD-10-CM

## 2016-11-11 DIAGNOSIS — I10 Essential (primary) hypertension: Secondary | ICD-10-CM | POA: Diagnosis not present

## 2016-11-11 MED ORDER — LOSARTAN POTASSIUM-HCTZ 50-12.5 MG PO TABS
1.0000 | ORAL_TABLET | Freq: Every day | ORAL | 11 refills | Status: DC
Start: 2016-11-11 — End: 2021-11-26

## 2016-11-11 NOTE — Progress Notes (Addendum)
PELVIC US TA/TV: Vaginal cuff wnl,simple right ovarian cyst 4.5 x 3.9 x 3.6 cm N/C (right ovary seen on T/A images only because of bowel gas),left ovary not visualized,no left adnexal masses seen,no free fluid,right adnexal pain during ultrasound.

## 2016-11-11 NOTE — Progress Notes (Signed)
Follow up appointment for results  Chief Complaint  Patient presents with  . Follow-up    ultrasound    Blood pressure 130/80, pulse 76, weight 177 lb 6.4 oz (80.5 kg).  US Transvaginal Non-ob  Result Date: 11/11/2016 GYNECOLOGIC SONOGRAM Melissa Rich is a 66 y.o. s/p hysterectomy,she is here for a pelvic sonogram to F/U right ovarian cyst. Uterus                     Surgically absent,vaginal cuff  WNL Endometrium         N/A Right ovary             4.5 x 3.9 x 3.6 cm, simple right ovarian cyst N/C,(right ovary seen on T/A images only because of bowel gas) Left ovary                Not visualized No free fluid Technician Comments: PELVIC US TA/TV: Vaginal cuff wnl,simple right ovarian cyst 4.5 x 3.9 x 3.6 cm N/C (right ovary seen on T/A images only because of bowel gas),left ovary not visualized,no left adnexal masses seen,no free fluid,right adnexal pain during ultrasound. Amber Flora Lipps 11/11/2016 9:41 AM Clinical Impression and recommendations: I have reviewed the sonogram results above, combined with the patient's current clinical course, below are my impressions and any appropriate recommendations for management based on the sonographic findings. Absent uterus Right ovarian cyst unchanged from 13 months ago, exactly the same Recommend evaluation as clinically indicated or every 2 years Melissa Rich 11/11/2016 10:04 AM   US Pelvis Complete  Result Date: 11/11/2016 GYNECOLOGIC SONOGRAM Melissa Rich is a 66 y.o. s/p hysterectomy,she is here for a pelvic sonogram to F/U right ovarian cyst. Uterus                     Surgically absent,vaginal cuff  WNL Endometrium         N/A Right ovary             4.5 x 3.9 x 3.6 cm, simple right ovarian cyst N/C,(right ovary seen on T/A images only because of bowel gas) Left ovary                Not visualized No free fluid Technician Comments: PELVIC US TA/TV: Vaginal cuff wnl,simple right ovarian cyst 4.5 x 3.9 x 3.6 cm N/C (right ovary seen on T/A images  only because of bowel gas),left ovary not visualized,no left adnexal masses seen,no free fluid,right adnexal pain during ultrasound. Amber Flora Lipps 11/11/2016 9:41 AM Clinical Impression and recommendations: I have reviewed the sonogram results above, combined with the patient's current clinical course, below are my impressions and any appropriate recommendations for management based on the sonographic findings. Absent uterus Right ovarian cyst unchanged from 13 months ago, exactly the same Recommend evaluation as clinically indicated or every 2 years Melissa Rich 11/11/2016 10:04 AM    Also BP is much improved, no side effects on hyzaar  MEDS ordered this encounter: Meds ordered this encounter  Medications  . ibuprofen (ADVIL,MOTRIN) 100 MG/5ML suspension    Sig: Take 200 mg by mouth every 4 (four) hours as needed.  Marland Kitchen losartan-hydrochlorothiazide (HYZAAR) 50-12.5 MG tablet    Sig: Take 1 tablet by mouth daily.    Dispense:  30 tablet    Refill:  11    Orders for this encounter: No orders of the defined types were placed in this encounter.   Impression: Cyst of ovary, unspecified laterality  Benign essential hypertension    Plan: Scan every 2 years or as clinically indicated Continue hyzaar, BP log reviewed and is in good range  Follow Up: Return in about 2 years (around 11/11/2018) for yearly, with Dr Despina HiddenEure.       Face to face time:  15 minutes  Greater than 50% of the visit time was spent in counseling and coordination of care with the patient.  The summary and outline of the counseling and care coordination is summarized in the note above.   All questions were answered.  Past Medical History:  Diagnosis Date  . Medical history non-contributory     Past Surgical History:  Procedure Laterality Date  . ABDOMINAL HYSTERECTOMY  1995  . COLONOSCOPY N/A 07/20/2013   Procedure: COLONOSCOPY;  Surgeon: Dalia HeadingMark A Jenkins, MD;  Location: [facility];  Service: Gastroenterology;   Laterality: N/A;    OB History    No data available      Allergies  Allergen Reactions  . Codeine Swelling    Ankle Area    Social History   Social History  . Marital status: Married    Spouse name: N/A  . Number of children: N/A  . Years of education: N/A   Social History Main Topics  . Smoking status: Never Smoker  . Smokeless tobacco: Never Used  . Alcohol use No  . Drug use: No  . Sexual activity: Not Asked   Other Topics Concern  . None   Social History Narrative  . None    Family History  Problem Relation Age of Onset  . Heart attack Mother   . Heart attack Father   . Cancer Other   . Heart disease Other

## 2017-01-07 ENCOUNTER — Ambulatory Visit (HOSPITAL_COMMUNITY)
Admission: RE | Admit: 2017-01-07 | Discharge: 2017-01-07 | Disposition: A | Discharge status: Home / Self Care | Payer: No Typology Code available for payment source | Source: Ambulatory Visit | Attending: Internal Medicine | Admitting: Internal Medicine

## 2017-01-07 ENCOUNTER — Other Ambulatory Visit (HOSPITAL_COMMUNITY): Payer: Self-pay | Admitting: Internal Medicine

## 2017-01-07 DIAGNOSIS — R918 Other nonspecific abnormal finding of lung field: Secondary | ICD-10-CM | POA: Diagnosis present

## 2017-01-07 DIAGNOSIS — I10 Essential (primary) hypertension: Secondary | ICD-10-CM | POA: Insufficient documentation

## 2017-01-07 DIAGNOSIS — R05 Cough: Secondary | ICD-10-CM

## 2017-01-07 DIAGNOSIS — E669 Obesity, unspecified: Secondary | ICD-10-CM | POA: Insufficient documentation

## 2017-01-07 DIAGNOSIS — R059 Cough, unspecified: Secondary | ICD-10-CM

## 2017-03-18 DIAGNOSIS — H35341 Macular cyst, hole, or pseudohole, right eye: Secondary | ICD-10-CM | POA: Insufficient documentation

## 2017-03-18 DIAGNOSIS — E782 Mixed hyperlipidemia: Secondary | ICD-10-CM | POA: Insufficient documentation

## 2017-03-18 DIAGNOSIS — E559 Vitamin D deficiency, unspecified: Secondary | ICD-10-CM | POA: Insufficient documentation

## 2017-08-18 ENCOUNTER — Other Ambulatory Visit: Payer: Self-pay | Admitting: Obstetrics & Gynecology

## 2017-08-18 DIAGNOSIS — Z1231 Encounter for screening mammogram for malignant neoplasm of breast: Secondary | ICD-10-CM

## 2017-09-08 ENCOUNTER — Ambulatory Visit (HOSPITAL_COMMUNITY)
Admission: RE | Admit: 2017-09-08 | Discharge: 2017-09-08 | Disposition: A | Discharge status: Home / Self Care | Payer: BLUE CROSS/BLUE SHIELD | Source: Ambulatory Visit | Attending: Obstetrics & Gynecology | Admitting: Obstetrics & Gynecology

## 2017-09-08 DIAGNOSIS — Z1231 Encounter for screening mammogram for malignant neoplasm of breast: Secondary | ICD-10-CM | POA: Diagnosis present

## 2017-09-17 ENCOUNTER — Other Ambulatory Visit (HOSPITAL_COMMUNITY): Payer: Self-pay | Admitting: Family Medicine

## 2017-09-17 DIAGNOSIS — Z780 Asymptomatic menopausal state: Secondary | ICD-10-CM

## 2017-11-13 ENCOUNTER — Ambulatory Visit (HOSPITAL_COMMUNITY)
Admission: RE | Admit: 2017-11-13 | Discharge: 2017-11-13 | Disposition: A | Discharge status: Home / Self Care | Payer: BLUE CROSS/BLUE SHIELD | Source: Ambulatory Visit | Attending: Family Medicine | Admitting: Family Medicine

## 2017-11-13 DIAGNOSIS — Z780 Asymptomatic menopausal state: Secondary | ICD-10-CM

## 2017-11-13 DIAGNOSIS — M810 Age-related osteoporosis without current pathological fracture: Secondary | ICD-10-CM | POA: Insufficient documentation

## 2018-04-02 ENCOUNTER — Other Ambulatory Visit (HOSPITAL_COMMUNITY): Payer: Self-pay | Admitting: Family Medicine

## 2018-04-02 DIAGNOSIS — Z136 Encounter for screening for cardiovascular disorders: Secondary | ICD-10-CM

## 2018-04-09 ENCOUNTER — Ambulatory Visit (HOSPITAL_COMMUNITY)
Admission: RE | Admit: 2018-04-09 | Discharge: 2018-04-09 | Disposition: A | Discharge status: Home / Self Care | Payer: Medicare Other | Source: Ambulatory Visit | Attending: Family Medicine | Admitting: Family Medicine

## 2018-04-09 DIAGNOSIS — Z136 Encounter for screening for cardiovascular disorders: Secondary | ICD-10-CM | POA: Diagnosis present

## 2018-08-03 ENCOUNTER — Other Ambulatory Visit (HOSPITAL_COMMUNITY): Payer: Self-pay | Admitting: Internal Medicine

## 2018-08-03 DIAGNOSIS — Z1231 Encounter for screening mammogram for malignant neoplasm of breast: Secondary | ICD-10-CM

## 2018-09-14 ENCOUNTER — Ambulatory Visit (HOSPITAL_COMMUNITY)
Admission: RE | Admit: 2018-09-14 | Discharge: 2018-09-14 | Disposition: A | Discharge status: Home / Self Care | Payer: Medicare Other | Source: Ambulatory Visit | Attending: Internal Medicine | Admitting: Internal Medicine

## 2018-09-14 DIAGNOSIS — Z1231 Encounter for screening mammogram for malignant neoplasm of breast: Secondary | ICD-10-CM | POA: Insufficient documentation

## 2018-10-15 DIAGNOSIS — H35342 Macular cyst, hole, or pseudohole, left eye: Secondary | ICD-10-CM | POA: Diagnosis not present

## 2018-10-20 ENCOUNTER — Encounter: Payer: Self-pay | Admitting: Obstetrics & Gynecology

## 2018-10-20 ENCOUNTER — Ambulatory Visit: Payer: Managed Care, Other (non HMO) | Admitting: Obstetrics & Gynecology

## 2018-10-20 VITALS — BP 119/78 | HR 68 | Ht 63.0 in | Wt 163.4 lb

## 2018-10-20 DIAGNOSIS — Z01419 Encounter for gynecological examination (general) (routine) without abnormal findings: Secondary | ICD-10-CM | POA: Diagnosis not present

## 2018-10-20 NOTE — Progress Notes (Signed)
Subjective:     Melissa Rich is a 68 y.o. female here for a routine exam.  No LMP recorded. Patient has had a hysterectomy. No obstetric history on file. Birth Control Method:  hysterectomy Menstrual Calendar(currently): amenorrheic  Current complaints: MD.   Current acute medical issues:  MD   Recent Gynecologic History No LMP recorded. Patient has had a hysterectomy. Last Pap: n/a,   Last mammogram: 12/2,  normal  Past Medical History:  Diagnosis Date  . Macular degeneration of both eyes   . Medical history non-contributory     Past Surgical History:  Procedure Laterality Date  . ABDOMINAL HYSTERECTOMY  1995  . COLONOSCOPY N/A 07/20/2013   Procedure: COLONOSCOPY;  Surgeon: Dalia HeadingMark A Jenkins, MD;  Location: [facility];  Service: Gastroenterology;  Laterality: N/A;    OB History   No obstetric history on file.     Social History   Socioeconomic History  . Marital status: Married    Spouse name: Not on file  . Number of children: Not on file  . Years of education: Not on file  . Highest education level: Not on file  Occupational History  . Not on file  Social Needs  . Financial resource strain: Not on file  . Food insecurity:    Worry: Not on file    Inability: Not on file  . Transportation needs:    Medical: Not on file    Non-medical: Not on file  Tobacco Use  . Smoking status: Never Smoker  . Smokeless tobacco: Never Used  Substance and Sexual Activity  . Alcohol use: No  . Drug use: No  . Sexual activity: Yes    Birth control/protection: Surgical  Lifestyle  . Physical activity:    Days per week: Not on file    Minutes per session: Not on file  . Stress: Not on file  Relationships  . Social connections:    Talks on phone: Not on file    Gets together: Not on file    Attends religious service: Not on file    Active member of club or organization: Not on file    Attends meetings of clubs or organizations: Not on file    Relationship status: Not  on file  Other Topics Concern  . Not on file  Social History Narrative  . Not on file    Family History  Problem Relation Age of Onset  . Heart attack Mother   . Heart attack Father   . Cancer Other   . Heart disease Other      Current Outpatient Medications:  .  alendronate (FOSAMAX) 70 MG tablet, Take 70 mg by mouth once a week. Take with a full glass of water on an empty stomach., Disp: , Rfl:  .  fenoprofen (NALFON) 600 MG TABS tablet, Take 600 mg by mouth., Disp: , Rfl:  .  hydrochlorothiazide (HYDRODIURIL) 12.5 MG tablet, Take 12.5 mg by mouth daily., Disp: , Rfl:  .  losartan-hydrochlorothiazide (HYZAAR) 50-12.5 MG tablet, Take 1 tablet by mouth daily., Disp: 30 tablet, Rfl: 11 .  Omega-3 Fatty Acids (FISH OIL) 1000 MG CAPS, Take by mouth., Disp: , Rfl:  .  OVER THE COUNTER MEDICATION, D3 1000 iu, Disp: , Rfl:  .  ibuprofen (ADVIL,MOTRIN) 100 MG/5ML suspension, Take 200 mg by mouth every 4 (four) hours as needed., Disp: , Rfl:   Review of Systems  Review of Systems  Constitutional: Negative for fever, chills, weight loss, malaise/fatigue and  diaphoresis.  HENT: Negative for hearing loss, ear pain, nosebleeds, congestion, sore throat, neck pain, tinnitus and ear discharge.   Eyes: Negative for blurred vision, double vision, photophobia, pain, discharge and redness.  Respiratory: Negative for cough, hemoptysis, sputum production, shortness of breath, wheezing and stridor.   Cardiovascular: Negative for chest pain, palpitations, orthopnea, claudication, leg swelling and PND.  Gastrointestinal: negative for abdominal pain. Negative for heartburn, nausea, vomiting, diarrhea, constipation, blood in stool and melena.  Genitourinary: Negative for dysuria, urgency, frequency, hematuria and flank pain.  Musculoskeletal: Negative for myalgias, back pain, joint pain and falls.  Skin: Negative for itching and rash.  Neurological: Negative for dizziness, tingling, tremors, sensory  change, speech change, focal weakness, seizures, loss of consciousness, weakness and headaches.  Endo/Heme/Allergies: Negative for environmental allergies and polydipsia. Does not bruise/bleed easily.  Psychiatric/Behavioral: Negative for depression, suicidal ideas, hallucinations, memory loss and substance abuse. The patient is not nervous/anxious and does not have insomnia.        Objective:  Blood pressure 119/78, pulse 68, height 5\' 3"  (1.6 m), weight 163 lb 6.4 oz (74.1 kg).   Physical Exam  Vitals reviewed. Constitutional: She is oriented to person, place, and time. She appears well-developed and well-nourished.  HENT:  Head: Normocephalic and atraumatic.        Right Ear: External ear normal.  Left Ear: External ear normal.  Nose: Nose normal.  Mouth/Throat: Oropharynx is clear and moist.  Eyes: Conjunctivae and EOM are normal. Pupils are equal, round, and reactive to light. Right eye exhibits no discharge. Left eye exhibits no discharge. No scleral icterus.  Neck: Normal range of motion. Neck supple. No tracheal deviation present. No thyromegaly present.  Cardiovascular: Normal rate, regular rhythm, normal heart sounds and intact distal pulses.  Exam reveals no gallop and no friction rub.   No murmur heard. Respiratory: Effort normal and breath sounds normal. No respiratory distress. She has no wheezes. She has no rales. She exhibits no tenderness.  GI: Soft. Bowel sounds are normal. She exhibits no distension and no mass. There is no tenderness. There is no rebound and no guarding.  Genitourinary:  Breasts no masses skin changes or nipple changes bilaterally      Vulva is normal without lesions Vagina is pink moist without discharge Cervix absent Uterus is absent Adnexa is negative with normal sized ovaries   Musculoskeletal: Normal range of motion. She exhibits no edema and no tenderness.  Neurological: She is alert and oriented to person, place, and time. She has normal  reflexes. She displays normal reflexes. No cranial nerve deficit. She exhibits normal muscle tone. Coordination normal.  Skin: Skin is warm and dry. No rash noted. No erythema. No pallor.  Psychiatric: She has a normal mood and affect. Her behavior is normal. Judgment and thought content normal.       Medications Ordered at today's visit: No orders of the defined types were placed in this encounter.   Other orders placed at today's visit: No orders of the defined types were placed in this encounter.     Assessment:    Healthy female exam.    Plan:    Mammogram ordered. Follow up in: 3 years.     Return in about 3 years (around 10/20/2021) for yearly.

## 2018-10-26 DIAGNOSIS — H35342 Macular cyst, hole, or pseudohole, left eye: Secondary | ICD-10-CM | POA: Diagnosis not present

## 2018-10-26 DIAGNOSIS — H43822 Vitreomacular adhesion, left eye: Secondary | ICD-10-CM | POA: Diagnosis not present

## 2018-10-26 DIAGNOSIS — H43811 Vitreous degeneration, right eye: Secondary | ICD-10-CM | POA: Diagnosis not present

## 2018-10-26 DIAGNOSIS — H2513 Age-related nuclear cataract, bilateral: Secondary | ICD-10-CM | POA: Diagnosis not present

## 2018-10-26 DIAGNOSIS — H53412 Scotoma involving central area, left eye: Secondary | ICD-10-CM | POA: Diagnosis not present

## 2018-12-22 DIAGNOSIS — H35352 Cystoid macular degeneration, left eye: Secondary | ICD-10-CM | POA: Diagnosis not present

## 2018-12-22 DIAGNOSIS — H35342 Macular cyst, hole, or pseudohole, left eye: Secondary | ICD-10-CM | POA: Diagnosis not present

## 2018-12-22 DIAGNOSIS — H43392 Other vitreous opacities, left eye: Secondary | ICD-10-CM | POA: Diagnosis not present

## 2019-03-10 DIAGNOSIS — Z0001 Encounter for general adult medical examination with abnormal findings: Secondary | ICD-10-CM | POA: Diagnosis not present

## 2019-03-10 DIAGNOSIS — E782 Mixed hyperlipidemia: Secondary | ICD-10-CM | POA: Diagnosis not present

## 2019-03-10 DIAGNOSIS — E663 Overweight: Secondary | ICD-10-CM | POA: Diagnosis not present

## 2019-03-10 DIAGNOSIS — I10 Essential (primary) hypertension: Secondary | ICD-10-CM | POA: Diagnosis not present

## 2019-03-10 DIAGNOSIS — M810 Age-related osteoporosis without current pathological fracture: Secondary | ICD-10-CM | POA: Diagnosis not present

## 2019-03-10 DIAGNOSIS — Z79899 Other long term (current) drug therapy: Secondary | ICD-10-CM | POA: Diagnosis not present

## 2019-03-10 DIAGNOSIS — E559 Vitamin D deficiency, unspecified: Secondary | ICD-10-CM | POA: Diagnosis not present

## 2019-03-17 DIAGNOSIS — I10 Essential (primary) hypertension: Secondary | ICD-10-CM | POA: Diagnosis not present

## 2019-03-17 DIAGNOSIS — E559 Vitamin D deficiency, unspecified: Secondary | ICD-10-CM | POA: Diagnosis not present

## 2019-03-17 DIAGNOSIS — Z79899 Other long term (current) drug therapy: Secondary | ICD-10-CM | POA: Diagnosis not present

## 2019-03-17 DIAGNOSIS — H35341 Macular cyst, hole, or pseudohole, right eye: Secondary | ICD-10-CM | POA: Diagnosis not present

## 2019-03-17 DIAGNOSIS — J309 Allergic rhinitis, unspecified: Secondary | ICD-10-CM | POA: Diagnosis not present

## 2019-03-17 DIAGNOSIS — Z0001 Encounter for general adult medical examination with abnormal findings: Secondary | ICD-10-CM | POA: Diagnosis not present

## 2019-03-17 DIAGNOSIS — E7849 Other hyperlipidemia: Secondary | ICD-10-CM | POA: Diagnosis not present

## 2019-03-17 DIAGNOSIS — M810 Age-related osteoporosis without current pathological fracture: Secondary | ICD-10-CM | POA: Diagnosis not present

## 2019-03-17 DIAGNOSIS — E782 Mixed hyperlipidemia: Secondary | ICD-10-CM | POA: Diagnosis not present

## 2019-05-28 DIAGNOSIS — H53412 Scotoma involving central area, left eye: Secondary | ICD-10-CM | POA: Diagnosis not present

## 2019-05-28 DIAGNOSIS — H35352 Cystoid macular degeneration, left eye: Secondary | ICD-10-CM | POA: Diagnosis not present

## 2019-05-28 DIAGNOSIS — H2513 Age-related nuclear cataract, bilateral: Secondary | ICD-10-CM | POA: Diagnosis not present

## 2019-05-28 DIAGNOSIS — H43392 Other vitreous opacities, left eye: Secondary | ICD-10-CM | POA: Diagnosis not present

## 2019-05-28 DIAGNOSIS — H35342 Macular cyst, hole, or pseudohole, left eye: Secondary | ICD-10-CM | POA: Diagnosis not present

## 2019-06-22 DIAGNOSIS — H35342 Macular cyst, hole, or pseudohole, left eye: Secondary | ICD-10-CM | POA: Diagnosis not present

## 2019-06-23 DIAGNOSIS — H35342 Macular cyst, hole, or pseudohole, left eye: Secondary | ICD-10-CM | POA: Diagnosis not present

## 2019-06-30 DIAGNOSIS — H35342 Macular cyst, hole, or pseudohole, left eye: Secondary | ICD-10-CM | POA: Diagnosis not present

## 2019-08-13 ENCOUNTER — Other Ambulatory Visit (HOSPITAL_COMMUNITY): Payer: Self-pay | Admitting: Family Medicine

## 2019-08-13 DIAGNOSIS — Z1231 Encounter for screening mammogram for malignant neoplasm of breast: Secondary | ICD-10-CM

## 2019-08-16 DIAGNOSIS — R600 Localized edema: Secondary | ICD-10-CM | POA: Diagnosis not present

## 2019-08-16 DIAGNOSIS — Z23 Encounter for immunization: Secondary | ICD-10-CM | POA: Diagnosis not present

## 2019-08-16 DIAGNOSIS — T7840XA Allergy, unspecified, initial encounter: Secondary | ICD-10-CM | POA: Diagnosis not present

## 2019-08-16 DIAGNOSIS — H05223 Edema of bilateral orbit: Secondary | ICD-10-CM | POA: Diagnosis not present

## 2019-09-17 ENCOUNTER — Ambulatory Visit (HOSPITAL_COMMUNITY): Payer: Medicare Other

## 2019-09-27 ENCOUNTER — Ambulatory Visit (HOSPITAL_COMMUNITY): Payer: Medicare HMO

## 2019-09-27 DIAGNOSIS — E7849 Other hyperlipidemia: Secondary | ICD-10-CM | POA: Diagnosis not present

## 2019-09-27 DIAGNOSIS — I10 Essential (primary) hypertension: Secondary | ICD-10-CM | POA: Diagnosis not present

## 2019-10-04 DIAGNOSIS — M810 Age-related osteoporosis without current pathological fracture: Secondary | ICD-10-CM | POA: Diagnosis not present

## 2019-10-04 DIAGNOSIS — I10 Essential (primary) hypertension: Secondary | ICD-10-CM | POA: Diagnosis not present

## 2019-10-04 DIAGNOSIS — Z79899 Other long term (current) drug therapy: Secondary | ICD-10-CM | POA: Diagnosis not present

## 2019-10-04 DIAGNOSIS — E559 Vitamin D deficiency, unspecified: Secondary | ICD-10-CM | POA: Diagnosis not present

## 2019-10-25 ENCOUNTER — Ambulatory Visit (HOSPITAL_COMMUNITY)
Admission: RE | Admit: 2019-10-25 | Discharge: 2019-10-25 | Disposition: A | Discharge status: Home / Self Care | Payer: Medicare HMO | Source: Ambulatory Visit | Attending: Family Medicine | Admitting: Family Medicine

## 2019-10-25 ENCOUNTER — Other Ambulatory Visit: Payer: Self-pay

## 2019-10-25 DIAGNOSIS — Z1231 Encounter for screening mammogram for malignant neoplasm of breast: Secondary | ICD-10-CM | POA: Diagnosis not present

## 2020-10-25 ENCOUNTER — Other Ambulatory Visit (HOSPITAL_COMMUNITY): Payer: Self-pay | Admitting: Family Medicine

## 2020-10-25 DIAGNOSIS — Z1231 Encounter for screening mammogram for malignant neoplasm of breast: Secondary | ICD-10-CM

## 2020-11-01 ENCOUNTER — Other Ambulatory Visit: Payer: Self-pay

## 2020-11-01 ENCOUNTER — Ambulatory Visit (HOSPITAL_COMMUNITY)
Admission: RE | Admit: 2020-11-01 | Discharge: 2020-11-01 | Disposition: A | Discharge status: Home / Self Care | Payer: Medicare HMO | Source: Ambulatory Visit | Attending: Family Medicine | Admitting: Family Medicine

## 2020-11-01 DIAGNOSIS — Z1231 Encounter for screening mammogram for malignant neoplasm of breast: Secondary | ICD-10-CM | POA: Diagnosis present

## 2021-10-17 ENCOUNTER — Other Ambulatory Visit (HOSPITAL_COMMUNITY): Payer: Self-pay | Admitting: Internal Medicine

## 2021-10-17 ENCOUNTER — Other Ambulatory Visit (HOSPITAL_COMMUNITY): Payer: Self-pay | Admitting: Family Medicine

## 2021-10-17 DIAGNOSIS — Z1231 Encounter for screening mammogram for malignant neoplasm of breast: Secondary | ICD-10-CM

## 2021-11-05 ENCOUNTER — Ambulatory Visit (HOSPITAL_COMMUNITY)
Admission: RE | Admit: 2021-11-05 | Discharge: 2021-11-05 | Disposition: A | Discharge status: Home / Self Care | Payer: Medicare HMO | Source: Ambulatory Visit | Attending: Family Medicine | Admitting: Family Medicine

## 2021-11-05 ENCOUNTER — Other Ambulatory Visit: Payer: Self-pay

## 2021-11-05 DIAGNOSIS — Z1231 Encounter for screening mammogram for malignant neoplasm of breast: Secondary | ICD-10-CM | POA: Insufficient documentation

## 2021-11-26 ENCOUNTER — Other Ambulatory Visit: Payer: Self-pay

## 2021-11-26 ENCOUNTER — Ambulatory Visit (INDEPENDENT_AMBULATORY_CARE_PROVIDER_SITE_OTHER): Payer: Medicare HMO | Admitting: Obstetrics & Gynecology

## 2021-11-26 ENCOUNTER — Encounter: Payer: Self-pay | Admitting: Obstetrics & Gynecology

## 2021-11-26 VITALS — BP 123/78 | HR 68 | Ht 63.0 in | Wt 168.0 lb

## 2021-11-26 DIAGNOSIS — Z1212 Encounter for screening for malignant neoplasm of rectum: Secondary | ICD-10-CM

## 2021-11-26 DIAGNOSIS — Z01419 Encounter for gynecological examination (general) (routine) without abnormal findings: Secondary | ICD-10-CM

## 2021-11-26 DIAGNOSIS — Z1211 Encounter for screening for malignant neoplasm of colon: Secondary | ICD-10-CM

## 2021-11-26 NOTE — Progress Notes (Signed)
Subjective:     Melissa Rich is a 71 y.o. female here for a routine exam.  No LMP recorded. Patient has had a hysterectomy. No obstetric history on file. Birth Control Method:  hysterectomy Menstrual Calendar(currently): n/ a Current complaints: none.   Current acute medical issues:  none   Recent Gynecologic History No LMP recorded. Patient has had a hysterectomy. Last Pap: years ago, has had a hysterectomy,   Last mammogram: 10/2021,  normal  Past Medical History:  Diagnosis Date   Macular degeneration of both eyes    Medical history non-contributory     Past Surgical History:  Procedure Laterality Date   ABDOMINAL HYSTERECTOMY  1995   COLONOSCOPY N/A 07/20/2013   Procedure: COLONOSCOPY;  Surgeon: Jamesetta So, MD;  Location: [facility];  Service: Gastroenterology;  Laterality: N/A;    OB History   No obstetric history on file.     Social History   Socioeconomic History   Marital status: Married    Spouse name: Not on file   Number of children: Not on file   Years of education: Not on file   Highest education level: Not on file  Occupational History   Not on file  Tobacco Use   Smoking status: Never   Smokeless tobacco: Never  Vaping Use   Vaping Use: Never used  Substance and Sexual Activity   Alcohol use: No   Drug use: No   Sexual activity: Yes    Birth control/protection: Surgical  Other Topics Concern   Not on file  Social History Narrative   Not on file   Social Determinants of Health   Financial Resource Strain: Low Risk    Difficulty of Paying Living Expenses: Not hard at all  Food Insecurity: No Food Insecurity   Worried About Charity fundraiser in the Last Year: Never true   [place] in the Last Year: Never true  Transportation Needs: No Transportation Needs   Lack of Transportation (Medical): No   Lack of Transportation (Non-Medical): No  Physical Activity: Sufficiently Active   Days of Exercise per Week: 5 days   Minutes  of Exercise per Session: 30 min  Stress: No Stress Concern Present   Feeling of Stress : Not at all  Social Connections: Moderately Integrated   Frequency of Communication with Friends and Family: Once a week   Frequency of Social Gatherings with Friends and Family: Once a week   Attends Religious Services: More than 4 times per year   Active Member of [employer] or Organizations: Yes   Attends Music therapist: More than 4 times per year   Marital Status: Married    Family History  Problem Relation Age of Onset   Heart attack Father    Heart attack Mother    Cancer Other    Heart disease Other      Current Outpatient Medications:    alendronate (FOSAMAX) 70 MG tablet, Take 70 mg by mouth once a week. Take with a full glass of water on an empty stomach., Disp: , Rfl:    Calcium Carb-Cholecalciferol (CALCIUM 1000 + D) 1000-20 MG-MCG TABS, Take by mouth., Disp: , Rfl:    hydrochlorothiazide (HYDRODIURIL) 12.5 MG tablet, Take 12.5 mg by mouth daily., Disp: , Rfl:    ibuprofen (ADVIL,MOTRIN) 100 MG/5ML suspension, Take 200 mg by mouth every 4 (four) hours as needed., Disp: , Rfl:    losartan (COZAAR) 50 MG tablet, Take 50 mg by  mouth daily., Disp: , Rfl:    Omega-3 Fatty Acids (FISH OIL) 1000 MG CAPS, Take by mouth., Disp: , Rfl:    OVER THE COUNTER MEDICATION, D3 1000 iu, Disp: , Rfl:    vitamin C (ASCORBIC ACID) 500 MG tablet, Take 500 mg by mouth daily., Disp: , Rfl:    fenoprofen (NALFON) 600 MG TABS tablet, Take 600 mg by mouth. (Patient not taking: Reported on 11/26/2021), Disp: , Rfl:   Review of Systems  Review of Systems  Constitutional: Negative for fever, chills, weight loss, malaise/fatigue and diaphoresis.  HENT: Negative for hearing loss, ear pain, nosebleeds, congestion, sore throat, neck pain, tinnitus and ear discharge.   Eyes: Negative for blurred vision, double vision, photophobia, pain, discharge and redness.  Respiratory: Negative for cough, hemoptysis,  sputum production, shortness of breath, wheezing and stridor.   Cardiovascular: Negative for chest pain, palpitations, orthopnea, claudication, leg swelling and PND.  Gastrointestinal: negative for abdominal pain. Negative for heartburn, nausea, vomiting, diarrhea, constipation, blood in stool and melena.  Genitourinary: Negative for dysuria, urgency, frequency, hematuria and flank pain.  Musculoskeletal: Negative for myalgias, back pain, joint pain and falls.  Skin: Negative for itching and rash.  Neurological: Negative for dizziness, tingling, tremors, sensory change, speech change, focal weakness, seizures, loss of consciousness, weakness and headaches.  Endo/Heme/Allergies: Negative for environmental allergies and polydipsia. Does not bruise/bleed easily.  Psychiatric/Behavioral: Negative for depression, suicidal ideas, hallucinations, memory loss and substance abuse. The patient is not nervous/anxious and does not have insomnia.        Objective:  Blood pressure 123/78, pulse 68, height 5\' 3"  (1.6 m), weight 168 lb (76.2 kg).   Physical Exam  Vitals reviewed. Constitutional: She is oriented to person, place, and time. She appears well-developed and well-nourished.  HENT:  Head: Normocephalic and atraumatic.        Right Ear: External ear normal.  Left Ear: External ear normal.  Nose: Nose normal.  Mouth/Throat: Oropharynx is clear and moist.  Eyes: Conjunctivae and EOM are normal. Pupils are equal, round, and reactive to light. Right eye exhibits no discharge. Left eye exhibits no discharge. No scleral icterus.  Neck: Normal range of motion. Neck supple. No tracheal deviation present. No thyromegaly present.  Cardiovascular: Normal rate, regular rhythm, normal heart sounds and intact distal pulses.  Exam reveals no gallop and no friction rub.   No murmur heard. Respiratory: Effort normal and breath sounds normal. No respiratory distress. She has no wheezes. She has no rales. She  exhibits no tenderness.  GI: Soft. Bowel sounds are normal. She exhibits no distension and no mass. There is no tenderness. There is no rebound and no guarding.  Genitourinary:  Breasts no masses skin changes or nipple changes bilaterally      Vulva is normal without lesions Vagina is pink moist without discharge Cervix absent and pap is not done Uterus is absent Adnexa is negative  {Rectal    hemoccult negative, normal tone, no masses  Musculoskeletal: Normal range of motion. She exhibits no edema and no tenderness.  Neurological: She is alert and oriented to person, place, and time. She has normal reflexes. She displays normal reflexes. No cranial nerve deficit. She exhibits normal muscle tone. Coordination normal.  Skin: Skin is warm and dry. No rash noted. No erythema. No pallor.  Psychiatric: She has a normal mood and affect. Her behavior is normal. Judgment and thought content normal.       Medications Ordered at today's visit: No orders  of the defined types were placed in this encounter.   Other orders placed at today's visit: No orders of the defined types were placed in this encounter.     Assessment:    Normal Gyn exam.    Plan:    Follow up in: 3 years.     No follow-ups on file.

## 2022-04-09 DIAGNOSIS — Z1329 Encounter for screening for other suspected endocrine disorder: Secondary | ICD-10-CM | POA: Insufficient documentation

## 2022-04-09 DIAGNOSIS — M810 Age-related osteoporosis without current pathological fracture: Secondary | ICD-10-CM | POA: Insufficient documentation

## 2022-04-09 DIAGNOSIS — Z79899 Other long term (current) drug therapy: Secondary | ICD-10-CM | POA: Insufficient documentation

## 2022-11-05 ENCOUNTER — Other Ambulatory Visit (HOSPITAL_COMMUNITY): Payer: Self-pay | Admitting: Adult Health

## 2022-11-05 DIAGNOSIS — Z1231 Encounter for screening mammogram for malignant neoplasm of breast: Secondary | ICD-10-CM

## 2022-11-13 ENCOUNTER — Ambulatory Visit (HOSPITAL_COMMUNITY)
Admission: RE | Admit: 2022-11-13 | Discharge: 2022-11-13 | Disposition: A | Discharge status: Home / Self Care | Payer: Medicare PPO | Source: Ambulatory Visit | Attending: Adult Health | Admitting: Adult Health

## 2022-11-13 DIAGNOSIS — Z1231 Encounter for screening mammogram for malignant neoplasm of breast: Secondary | ICD-10-CM

## 2023-04-07 ENCOUNTER — Other Ambulatory Visit (HOSPITAL_COMMUNITY): Payer: Self-pay | Admitting: Adult Health

## 2023-04-07 DIAGNOSIS — Z13820 Encounter for screening for osteoporosis: Secondary | ICD-10-CM

## 2023-04-10 ENCOUNTER — Encounter: Payer: Self-pay | Admitting: *Deleted

## 2023-04-25 ENCOUNTER — Ambulatory Visit (HOSPITAL_COMMUNITY)
Admission: RE | Admit: 2023-04-25 | Discharge: 2023-04-25 | Disposition: A | Discharge status: Home / Self Care | Payer: Medicare PPO | Source: Ambulatory Visit | Attending: Adult Health | Admitting: Adult Health

## 2023-04-25 DIAGNOSIS — Z780 Asymptomatic menopausal state: Secondary | ICD-10-CM | POA: Diagnosis not present

## 2023-04-25 DIAGNOSIS — Z13820 Encounter for screening for osteoporosis: Secondary | ICD-10-CM | POA: Insufficient documentation

## 2023-04-25 DIAGNOSIS — M8589 Other specified disorders of bone density and structure, multiple sites: Secondary | ICD-10-CM | POA: Diagnosis not present

## 2023-05-06 ENCOUNTER — Telehealth: Payer: Self-pay | Admitting: Internal Medicine

## 2023-05-06 NOTE — Telephone Encounter (Signed)
Received questionnaire, in review.

## 2023-05-07 ENCOUNTER — Telehealth: Payer: Self-pay | Admitting: *Deleted

## 2023-05-07 DIAGNOSIS — Z1211 Encounter for screening for malignant neoplasm of colon: Secondary | ICD-10-CM

## 2023-05-07 NOTE — Telephone Encounter (Signed)
  Procedure: Colonoscopy  Height: 5'3 Weight: 165lbs        Have you had a colonoscopy before?  2014 Dr. Lovell Sheehan  Do you have family history of colon cancer?  no  Do you have a family history of polyps? no  Previous colonoscopy with polyps removed? no  Do you have a history colorectal cancer?   no  Are you diabetic?  no  Do you have a prosthetic or mechanical heart valve? no  Do you have a pacemaker/defibrillator?   no  Have you had endocarditis/atrial fibrillation?  no  Do you use supplemental oxygen/CPAP?  no  Have you had joint replacement within the last 12 months?  no  Do you tend to be constipated or have to use laxatives?  no   Do you have history of alcohol use? If yes, how much and how often.  nono  Do you have history or are you using drugs? If yes, what do are you  using?  no  Have you ever had a stroke/heart attack?  no  Have you ever had a heart or other vascular stent placed,?no  Do you take weight loss medication? no  female patients,: have you had a hysterectomy? yes                              are you post menopausal?  yes                              do you still have your menstrual cycle? no    Date of last menstrual period?   Do you take any blood-thinning medications such as: (Plavix, aspirin, Coumadin, Aggrenox, Brilinta, Xarelto, Eliquis, Pradaxa, Savaysa or Effient)? no  If yes we need the name, milligram, dosage and who is prescribing doctor:               Current Outpatient Medications  Medication Sig Dispense Refill   alendronate (FOSAMAX) 70 MG tablet Take 70 mg by mouth once a week. Take with a full glass of water on an empty stomach.     atorvastatin (LIPITOR) 10 MG tablet Take 10 mg by mouth daily.     Calcium Carb-Cholecalciferol (CALCIUM 1000 + D) 1000-20 MG-MCG TABS Take by mouth.     hydrochlorothiazide (HYDRODIURIL) 12.5 MG tablet Take 12.5 mg by mouth daily.     losartan (COZAAR) 50 MG tablet Take 50 mg by mouth daily.      Omega-3 Fatty Acids (FISH OIL) 1000 MG CAPS Take by mouth.     OVER THE COUNTER MEDICATION D3 1000 iu     vitamin C (ASCORBIC ACID) 500 MG tablet Take 500 mg by mouth daily.     No current facility-administered medications for this visit.    Allergies  Allergen Reactions   Codeine Swelling    Ankle Area

## 2023-06-24 NOTE — Telephone Encounter (Signed)
Pt called back. Wants to wait for Oct schedule. Will call once received

## 2023-06-24 NOTE — Telephone Encounter (Signed)
LMOVM to schedule TCS with Dr. Gala Romney

## 2023-06-30 NOTE — Telephone Encounter (Signed)
Pt left vm returning call  [facility]

## 2023-06-30 NOTE — Telephone Encounter (Signed)
LMTRC

## 2023-06-30 NOTE — Telephone Encounter (Signed)
Pt called back. Scheduled for 10/3 with Dr. Jena Gauss. Aware needs lab work prior. She is coming to office to pick up instructions/prep. Wants labs done at [facility]. Order placed.    PA approved via [employer]here. Authorization #[id number], DOS:  07/17/2023 - 09/16/2023

## 2023-06-30 NOTE — Addendum Note (Signed)
Addended by: Armstead Peaks on: 06/30/2023 10:54 AM   Modules accepted: Orders

## 2023-07-01 ENCOUNTER — Other Ambulatory Visit: Payer: Self-pay | Admitting: *Deleted

## 2023-07-01 DIAGNOSIS — Z1211 Encounter for screening for malignant neoplasm of colon: Secondary | ICD-10-CM

## 2023-07-02 ENCOUNTER — Encounter: Payer: Self-pay | Admitting: *Deleted

## 2023-07-02 NOTE — Telephone Encounter (Signed)
Referral completed

## 2023-07-15 NOTE — Telephone Encounter (Signed)
Pt left vm wanting to ask a question about upcoming procedure  Called pt and she wanted to know if she needed to go to hospital for anything before upcoming procedure. Advised pt that she didn't need to go to hospital. Verbalized understanding.

## 2023-07-17 ENCOUNTER — Encounter (HOSPITAL_COMMUNITY)
Admission: RE | Disposition: A | Discharge status: Home / Self Care | Payer: Self-pay | Source: Home / Self Care | Attending: Internal Medicine

## 2023-07-17 ENCOUNTER — Ambulatory Visit (HOSPITAL_COMMUNITY)
Admission: RE | Admit: 2023-07-17 | Discharge: 2023-07-17 | Disposition: A | Discharge status: Home / Self Care | Payer: Medicare PPO | Attending: Internal Medicine | Admitting: Internal Medicine

## 2023-07-17 ENCOUNTER — Other Ambulatory Visit: Payer: Self-pay

## 2023-07-17 ENCOUNTER — Ambulatory Visit (HOSPITAL_BASED_OUTPATIENT_CLINIC_OR_DEPARTMENT_OTHER): Payer: Medicare PPO | Admitting: Anesthesiology

## 2023-07-17 ENCOUNTER — Encounter (HOSPITAL_COMMUNITY): Payer: Self-pay | Admitting: Internal Medicine

## 2023-07-17 ENCOUNTER — Ambulatory Visit (HOSPITAL_COMMUNITY): Payer: Medicare PPO | Admitting: Anesthesiology

## 2023-07-17 DIAGNOSIS — Z1211 Encounter for screening for malignant neoplasm of colon: Secondary | ICD-10-CM

## 2023-07-17 DIAGNOSIS — D127 Benign neoplasm of rectosigmoid junction: Secondary | ICD-10-CM | POA: Insufficient documentation

## 2023-07-17 DIAGNOSIS — D126 Benign neoplasm of colon, unspecified: Secondary | ICD-10-CM | POA: Diagnosis not present

## 2023-07-17 HISTORY — PX: POLYPECTOMY: SHX5525

## 2023-07-17 HISTORY — PX: COLONOSCOPY WITH PROPOFOL: SHX5780

## 2023-07-17 SURGERY — COLONOSCOPY WITH PROPOFOL
Anesthesia: General

## 2023-07-17 MED ORDER — PHENYLEPHRINE 80 MCG/ML (10ML) SYRINGE FOR IV PUSH (FOR BLOOD PRESSURE SUPPORT)
PREFILLED_SYRINGE | INTRAVENOUS | Status: AC
  Filled 2023-07-17: qty 10

## 2023-07-17 MED ORDER — LIDOCAINE HCL (CARDIAC) PF 100 MG/5ML IV SOSY
PREFILLED_SYRINGE | INTRAVENOUS | Status: DC | PRN
  Administered 2023-07-17: 50 mg via INTRAVENOUS

## 2023-07-17 MED ORDER — LACTATED RINGERS IV SOLN: INTRAVENOUS | Status: DC

## 2023-07-17 MED ORDER — LACTATED RINGERS IV SOLN
INTRAVENOUS | Status: DC | PRN
Start: 2023-07-17 — End: 2023-07-17

## 2023-07-17 MED ORDER — PROPOFOL 10 MG/ML IV BOLUS
INTRAVENOUS | Status: DC | PRN
  Administered 2023-07-17: 100 mg via INTRAVENOUS

## 2023-07-17 MED ORDER — PHENYLEPHRINE 80 MCG/ML (10ML) SYRINGE FOR IV PUSH (FOR BLOOD PRESSURE SUPPORT)
PREFILLED_SYRINGE | INTRAVENOUS | Status: DC | PRN
Start: 2023-07-17 — End: 2023-07-17
  Administered 2023-07-17 (×2): 160 ug via INTRAVENOUS

## 2023-07-17 MED ORDER — PROPOFOL 500 MG/50ML IV EMUL
INTRAVENOUS | Status: DC | PRN
  Administered 2023-07-17: 150 ug/kg/min via INTRAVENOUS

## 2023-07-17 NOTE — H&P (Signed)
@LOGO @   Primary Care Physician:  Kara Pacer, NP Primary Gastroenterologist:  Dr. Jena Gauss  Pre-Procedure History & Physical: HPI:  Melissa Rich is a 72 y.o. female is here for a screening colonoscopy.  Negative colonoscopy 10 years ago.  No bowel symptoms.  Past Medical History:  Diagnosis Date   Macular degeneration of both eyes    Medical history non-contributory     Past Surgical History:  Procedure Laterality Date   ABDOMINAL HYSTERECTOMY  1995   COLONOSCOPY N/A 07/20/2013   Procedure: COLONOSCOPY;  Surgeon: Dalia Heading, MD;  Location: [facility];  Service: Gastroenterology;  Laterality: N/A;    Prior to Admission medications   Medication Sig Start Date End Date Taking? Authorizing Provider  alendronate (FOSAMAX) 70 MG tablet Take 70 mg by mouth once a week. Take with a full glass of water on an empty stomach.   Yes [provider]  atorvastatin (LIPITOR) 10 MG tablet Take 10 mg by mouth daily.   Yes [provider]  Calcium Carb-Cholecalciferol (CALCIUM 1000 + D) 1000-20 MG-MCG TABS Take by mouth.   Yes [provider]  hydrochlorothiazide (HYDRODIURIL) 12.5 MG tablet Take 12.5 mg by mouth daily.   Yes [provider]  losartan (COZAAR) 50 MG tablet Take 50 mg by mouth daily.   Yes [provider]  Omega-3 Fatty Acids (FISH OIL) 1000 MG CAPS Take by mouth.   Yes [provider]  OVER THE COUNTER MEDICATION D3 1000 iu   Yes [provider]  vitamin C (ASCORBIC ACID) 500 MG tablet Take 500 mg by mouth daily.   Yes [provider]    Allergies as of 06/30/2023 - Review Complete 05/07/2023  Allergen Reaction Noted   Codeine Swelling 07/08/2013    Family History  Problem Relation Age of Onset   Heart attack Father    Heart attack Mother    Cancer Other    Heart disease Other     Social History   Socioeconomic History   Marital status: Married    Spouse name: Not on file    Number of children: Not on file   Years of education: Not on file   Highest education level: Not on file  Occupational History   Not on file  Tobacco Use   Smoking status: Never   Smokeless tobacco: Never  Vaping Use   Vaping status: Never Used  Substance and Sexual Activity   Alcohol use: No   Drug use: No   Sexual activity: Yes    Birth control/protection: Surgical  Other Topics Concern   Not on file  Social History Narrative   Not on file   Social Determinants of Health   Financial Resource Strain: Low Risk  (11/26/2021)   Overall Financial Resource Strain (CARDIA)    Difficulty of Paying Living Expenses: Not hard at all  Food Insecurity: No Food Insecurity (11/26/2021)   Hunger Vital Sign    Worried About Running Out of Food in the Last Year: Never true    Ran Out of Food in the Last Year: Never true  Transportation Needs: No Transportation Needs (11/26/2021)   PRAPARE - Administrator, Civil Service (Medical): No    Lack of Transportation (Non-Medical): No  Physical Activity: Sufficiently Active (11/26/2021)   Exercise Vital Sign    Days of Exercise per Week: 5 days    Minutes of Exercise per Session: 30 min  Stress: No Stress Concern Present (11/26/2021)  [employer] of Occupational Health - Occupational Stress Questionnaire    Feeling of Stress : Not at all  Social Connections: Moderately Integrated (11/26/2021)   Social Connection and Isolation Panel [NHANES]    Frequency of Communication with Friends and Family: Once a week    Frequency of Social Gatherings with Friends and Family: Once a week    Attends Religious Services: More than 4 times per year    Active Member of [employer] or Organizations: Yes    Attends Engineer, structural: More than 4 times per year    Marital Status: Married  Catering manager Violence: Not At Risk (11/26/2021)   Humiliation, Afraid, Rape, and Kick questionnaire    Fear of Current or Ex-Partner: No    Emotionally  Abused: No    Physically Abused: No    Sexually Abused: No    Review of Systems: See HPI, otherwise negative ROS  Physical Exam: BP 128/67   Pulse 79   Temp 97.9 F (36.6 C) (Oral)   Resp 10   Ht 5\' 3"  (1.6 m)   Wt 74.8 kg   SpO2 99%   BMI 29.23 kg/m  General:   Alert,  Well-developed, well-nourished, pleasant and cooperative in NAD Lungs:  Clear throughout to auscultation.   No wheezes, crackles, or rhonchi. No acute distress. Heart:  Regular rate and rhythm; no murmurs, clicks, rubs,  or gallops. Abdomen:  Soft, nontender and nondistended. No masses, hepatosplenomegaly or hernias noted. Normal bowel sounds, without guarding, and without rebound.   Impression/Plan: Melissa Rich is now here to undergo a screening colonoscopy.  Average risk screening examination.  Risks, benefits, limitations, imponderables and alternatives regarding colonoscopy have been reviewed with the patient. Questions have been answered. All parties agreeable.     Notice:  This dictation was prepared with Dragon dictation along with smaller phrase technology. Any transcriptional errors that result from this process are unintentional and may not be corrected upon review.

## 2023-07-17 NOTE — Anesthesia Postprocedure Evaluation (Signed)
Anesthesia Post Note  Patient: Melissa Rich  Procedure(s) Performed: COLONOSCOPY WITH PROPOFOL POLYPECTOMY  Patient location during evaluation: PACU Anesthesia Type: General Level of consciousness: awake and alert Pain management: pain level controlled Vital Signs Assessment: post-procedure vital signs reviewed and stable Respiratory status: spontaneous breathing, nonlabored ventilation, respiratory function stable and patient connected to nasal cannula oxygen Cardiovascular status: blood pressure returned to baseline and stable Postop Assessment: no apparent nausea or vomiting Anesthetic complications: no   There were no known notable events for this encounter.   Last Vitals:  Vitals:   07/17/23 0818 07/17/23 1051  BP: 128/67 121/66  Pulse: 79 65  Resp: 10 16  Temp: 36.6 C 36.8 C  SpO2: 99% 98%    Last Pain:  Vitals:   07/17/23 1051  TempSrc: Axillary  PainSc: 0-No pain                 Arth Nicastro L Manish Ruggiero

## 2023-07-17 NOTE — Discharge Instructions (Addendum)
  Colonoscopy Discharge Instructions  Read the instructions outlined below and refer to this sheet in the next few weeks. These discharge instructions provide you with general information on caring for yourself after you leave the hospital. Your doctor may also give you specific instructions. While your treatment has been planned according to the most current medical practices available, unavoidable complications occasionally occur. If you have any problems or questions after discharge, call Dr. Jena Gauss at [phone]. ACTIVITY You may resume your regular activity, but move at a slower pace for the next 24 hours.  Take frequent rest periods for the next 24 hours.  Walking will help get rid of the air and reduce the bloated feeling in your belly (abdomen).  No driving for 24 hours (because of the medicine (anesthesia) used during the test).   Do not sign any important legal documents or operate any machinery for 24 hours (because of the anesthesia used during the test).  NUTRITION Drink plenty of fluids.  You may resume your normal diet as instructed by your doctor.  Begin with a light meal and progress to your normal diet. Heavy or fried foods are harder to digest and may make you feel sick to your stomach (nauseated).  Avoid alcoholic beverages for 24 hours or as instructed.  MEDICATIONS You may resume your normal medications unless your doctor tells you otherwise.  WHAT YOU CAN EXPECT TODAY Some feelings of bloating in the abdomen.  Passage of more gas than usual.  Spotting of blood in your stool or on the toilet paper.  IF YOU HAD POLYPS REMOVED DURING THE COLONOSCOPY: No aspirin products for 7 days or as instructed.  No alcohol for 7 days or as instructed.  Eat a soft diet for the next 24 hours.  FINDING OUT THE RESULTS OF YOUR TEST Not all test results are available during your visit. If your test results are not back during the visit, make an appointment with your caregiver to find out the  results. Do not assume everything is normal if you have not heard from your caregiver or the medical facility. It is important for you to follow up on all of your test results.  SEEK IMMEDIATE MEDICAL ATTENTION IF: You have more than a spotting of blood in your stool.  Your belly is swollen (abdominal distention).  You are nauseated or vomiting.  You have a temperature over 101.  You have abdominal pain or discomfort that is severe or gets worse throughout the day.     1 small polyp removed from your colon today  Further recommendations to follow pending review of pathology report

## 2023-07-17 NOTE — Anesthesia Procedure Notes (Signed)
Date/Time: 07/17/2023 10:37 AM  Performed by: Julian Reil, CRNAPre-anesthesia Checklist: Patient identified, Emergency Drugs available, Suction available and Patient being monitored Patient Re-evaluated:Patient Re-evaluated prior to induction Oxygen Delivery Method: Nasal cannula Induction Type: IV induction Placement Confirmation: positive ETCO2

## 2023-07-17 NOTE — Transfer of Care (Signed)
Immediate Anesthesia Transfer of Care Note  Patient: Melissa Rich  Procedure(s) Performed: COLONOSCOPY WITH PROPOFOL POLYPECTOMY  Patient Location: Endoscopy Unit  Anesthesia Type:General  Level of Consciousness: drowsy  Airway & Oxygen Therapy: Patient Spontanous Breathing  Post-op Assessment: Report given to RN and Post -op Vital signs reviewed and stable  Post vital signs: Reviewed and stable  Last Vitals:  Vitals Value Taken Time  BP    Temp    Pulse    Resp    SpO2      Last Pain:  Vitals:   07/17/23 1034  TempSrc:   PainSc: 0-No pain      Patients Stated Pain Goal: 6 (07/17/23 0818)  Complications: No notable events documented.

## 2023-07-17 NOTE — Op Note (Signed)
[facility] Patient Name: Melissa Rich Procedure Date: 07/17/2023 10:08 AM MRN: [id number] Date of Birth: Jan 01, 1951 Attending MD: Gennette Pac , MD, [id number] CSN: [id number] Age: 72 Admit Type: Outpatient Procedure:                Colonoscopy Indications:              Screening for colorectal malignant neoplasm Providers:                Gennette Pac, MD, Crystal Page, Dyann Ruddle Referring MD:              Medicines:                Propofol per Anesthesia Complications:            No immediate complications. Estimated Blood Loss:     Estimated blood loss was minimal. Procedure:                Pre-Anesthesia Assessment:                           - Prior to the procedure, a History and Physical                            was performed, and patient medications and                            allergies were reviewed. The patient's tolerance of                            previous anesthesia was also reviewed. The risks                            and benefits of the procedure and the sedation                            options and risks were discussed with the patient.                            All questions were answered, and informed consent                            was obtained. Prior Anticoagulants: The patient has                            taken no anticoagulant or antiplatelet agents. ASA                            Grade Assessment: II - A patient with mild systemic                            disease. After reviewing the risks and benefits,                            the patient was deemed in satisfactory condition to  undergo the procedure.                           After obtaining informed consent, the colonoscope                            was passed under direct vision. Throughout the                            procedure, the patient's blood pressure, pulse, and                            oxygen saturations were monitored  continuously. The                            [id number] ([id number]) was introduced through the                            anus and advanced to the the cecum, identified by                            appendiceal orifice and ileocecal valve. The                            colonoscopy was performed without difficulty. The                            patient tolerated the procedure well. The quality                            of the bowel preparation was adequate. The                            ileocecal valve, appendiceal orifice, and rectum                            were photographed. The entire colon was well                            visualized. Scope In: 10:36:13 AM Scope Out: 10:48:17 AM Scope Withdrawal Time: 0 hours 7 minutes 20 seconds  Total Procedure Duration: 0 hours 12 minutes 4 seconds  Findings:      The perianal and digital rectal examinations were normal.      A 5 mm polyp was found in the recto-sigmoid colon. The polyp was       semi-pedunculated. The polyp was removed with a cold snare. Resection       and retrieval were complete. Estimated blood loss was minimal.      The exam was otherwise without abnormality on direct and retroflexion       views. Impression:               - One 5 mm polyp at the recto-sigmoid colon,                            removed  with a cold snare. Resected and retrieved.                           - The examination was otherwise normal on direct                            and retroflexion views. Moderate Sedation:      Moderate (conscious) sedation was personally administered by an       anesthesia professional. The following parameters were monitored: oxygen       saturation, heart rate, blood pressure, respiratory rate, EKG, adequacy       of pulmonary ventilation, and response to care. Recommendation:           - Patient has a contact number available for                            emergencies. The signs and symptoms of potential                             delayed complications were discussed with the                            patient. Return to normal activities tomorrow.                            Written discharge instructions were provided to the                            patient.                           - Advance diet as tolerated.                           - Continue present medications.                           - Repeat colonoscopy date to be determined after                            pending pathology results are reviewed for                            surveillance.                           - Return to GI office (date not yet determined). 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 All rights reserved. The codes documented in this report are preliminary and upon coder review may  be revised to meet current compliance requirements. Gerrit Friends. Jaysion Ramseyer, MD Gennette Pac, MD 07/17/2023 10:52:49 AM This report has been signed electronically. Number of Addenda: 0

## 2023-07-17 NOTE — Anesthesia Preprocedure Evaluation (Addendum)
Anesthesia Evaluation  Patient identified by MRN, date of birth, ID band Patient awake    Reviewed: Allergy & Precautions, H&P , NPO status , Patient's Chart, lab work & pertinent test results, reviewed documented beta blocker date and time   Airway Mallampati: II  TM Distance: >3 FB Neck ROM: full    Dental no notable dental hx. (+) Dental Advisory Given   Pulmonary neg pulmonary ROS   Pulmonary exam normal breath sounds clear to auscultation       Cardiovascular Exercise Tolerance: Good negative cardio ROS Normal cardiovascular exam Rhythm:regular Rate:Normal     Neuro/Psych Macular deg  negative psych ROS   GI/Hepatic negative GI ROS, Neg liver ROS,,,  Endo/Other  negative endocrine ROS    Renal/GU negative Renal ROS  negative genitourinary   Musculoskeletal   Abdominal   Peds  Hematology negative hematology ROS (+)   Anesthesia Other Findings   Reproductive/Obstetrics negative OB ROS                             Anesthesia Physical Anesthesia Plan  ASA: 2  Anesthesia Plan: General   Post-op Pain Management: Minimal or no pain anticipated   Induction: Intravenous  PONV Risk Score and Plan: Propofol infusion  Airway Management Planned: Nasal Cannula and Natural Airway  Additional Equipment: None  Intra-op Plan:   Post-operative Plan:   Informed Consent: I have reviewed the patients History and Physical, chart, labs and discussed the procedure including the risks, benefits and alternatives for the proposed anesthesia with the patient or authorized representative who has indicated his/her understanding and acceptance.     Dental Advisory Given  Plan Discussed with: CRNA  Anesthesia Plan Comments:        Anesthesia Quick Evaluation

## 2023-07-21 ENCOUNTER — Encounter: Payer: Self-pay | Admitting: Internal Medicine

## 2023-07-21 LAB — SURGICAL PATHOLOGY

## 2023-07-25 ENCOUNTER — Encounter (HOSPITAL_COMMUNITY): Payer: Self-pay | Admitting: Internal Medicine

## 2023-11-07 ENCOUNTER — Other Ambulatory Visit (HOSPITAL_COMMUNITY): Payer: Self-pay | Admitting: Adult Health

## 2023-11-07 DIAGNOSIS — Z1231 Encounter for screening mammogram for malignant neoplasm of breast: Secondary | ICD-10-CM

## 2023-11-19 ENCOUNTER — Ambulatory Visit (HOSPITAL_COMMUNITY)
Admission: RE | Admit: 2023-11-19 | Discharge: 2023-11-19 | Disposition: A | Discharge status: Home / Self Care | Payer: Medicare Other | Source: Ambulatory Visit | Attending: Adult Health | Admitting: Adult Health

## 2023-11-19 ENCOUNTER — Encounter (HOSPITAL_COMMUNITY): Payer: Self-pay

## 2023-11-19 DIAGNOSIS — Z1231 Encounter for screening mammogram for malignant neoplasm of breast: Secondary | ICD-10-CM | POA: Insufficient documentation

## 2023-11-25 ENCOUNTER — Other Ambulatory Visit (HOSPITAL_COMMUNITY): Payer: Self-pay | Admitting: Adult Health

## 2023-11-25 DIAGNOSIS — R928 Other abnormal and inconclusive findings on diagnostic imaging of breast: Secondary | ICD-10-CM

## 2023-12-02 ENCOUNTER — Encounter (HOSPITAL_COMMUNITY): Payer: Self-pay

## 2023-12-02 ENCOUNTER — Ambulatory Visit (HOSPITAL_COMMUNITY)
Admission: RE | Admit: 2023-12-02 | Discharge: 2023-12-02 | Disposition: A | Discharge status: Home / Self Care | Payer: Medicare Other | Source: Ambulatory Visit | Attending: Adult Health | Admitting: Adult Health

## 2023-12-02 DIAGNOSIS — R928 Other abnormal and inconclusive findings on diagnostic imaging of breast: Secondary | ICD-10-CM | POA: Diagnosis present

## 2023-12-03 ENCOUNTER — Other Ambulatory Visit (HOSPITAL_COMMUNITY): Payer: Self-pay | Admitting: Adult Health

## 2023-12-03 DIAGNOSIS — R921 Mammographic calcification found on diagnostic imaging of breast: Secondary | ICD-10-CM

## 2023-12-10 ENCOUNTER — Ambulatory Visit
Admission: RE | Admit: 2023-12-10 | Discharge: 2023-12-10 | Disposition: A | Discharge status: Home / Self Care | Payer: Medicare Other | Source: Ambulatory Visit | Attending: Adult Health | Admitting: Adult Health

## 2023-12-10 ENCOUNTER — Ambulatory Visit
Admission: RE | Admit: 2023-12-10 | Discharge: 2023-12-10 | Disposition: A | Discharge status: Home / Self Care | Payer: Medicare Other | Source: Ambulatory Visit | Attending: Adult Health

## 2023-12-10 DIAGNOSIS — R921 Mammographic calcification found on diagnostic imaging of breast: Secondary | ICD-10-CM

## 2023-12-11 LAB — SURGICAL PATHOLOGY

## 2023-12-18 ENCOUNTER — Other Ambulatory Visit: Payer: Self-pay | Admitting: *Deleted

## 2023-12-18 DIAGNOSIS — D0511 Intraductal carcinoma in situ of right breast: Secondary | ICD-10-CM

## 2023-12-25 ENCOUNTER — Encounter: Payer: Self-pay | Admitting: General Surgery

## 2023-12-25 ENCOUNTER — Ambulatory Visit: Payer: Medicare Other | Admitting: General Surgery

## 2023-12-25 VITALS — BP 130/80 | HR 79 | Temp 98.0°F | Resp 12 | Ht 63.0 in | Wt 172.0 lb

## 2023-12-25 DIAGNOSIS — D0511 Intraductal carcinoma in situ of right breast: Secondary | ICD-10-CM | POA: Diagnosis not present

## 2023-12-25 NOTE — Patient Instructions (Signed)
 Two options: 1.) Lumpectomy (AKA partial mastectomy) plus a lymph node biopsy. This would require two tags to be placed in the area before surgery. Then the "lumps" of breast would be removed with surgery and the lymph node biopsy. After a lumpectomy you will likely need radiation after surgery and that is typically M-F for 5 weeks  2.) Mastectomy (removing the whole breast) plus a lymph node biopsy- You would not need radiation  You may need more treatment like chemotherapy or other treatments with either option but we would not know until the results come back and Oncology Sees you.   Surgical Options for Early-Stage Breast Cancer Surgery is usually the first treatment for early-stage breast cancer. There are two surgery options that result in good rates of cancer survival. They include: Partial mastectomy. Mastectomy. Breast cancer is different for everyone, even in its early stage. The best treatment for one person might not be the best treatment for another. Learn as much as you can about your cancer, and work closely with your health care providers to make the choice that produces the best results for you. What is partial mastectomy? Partial mastectomy, also called breast-sparing surgery or breast-conserving surgery, is surgery to remove the cancer along with some normal breast tissue that surrounds it. Lymph nodes from under the arm may also be removed and tested to find out if the cancer has spread. If cancer is located near the chest wall, part of the chest wall lining may also be removed. What is a mastectomy? A mastectomy is surgery to remove the cancer along with the entire breast tissue. There are several types of mastectomy: Simple or total mastectomy. In this surgery, the entire breast is removed, including breast tissue, nipple, areola, and skin around the breast. Some lymph nodes may also be removed from under the arm. If cancer is located near the chest wall, part of the chest wall  lining may also be removed. Skin-sparing mastectomy. In this surgery, the breast tissue, nipple, and areola are removed, and most of the skin over the breast is left in place. This surgery results in less scar tissue than other mastectomy surgeries, which allows for a more natural breast reconstruction. Nipple-sparing mastectomy. In this surgery, breast tissue is removed, but the skin and nipple are left in place. The tissue under the nipple and areola may be removed if cancer is found in the area. This may be an option for those who choose to have breast reconstruction after mastectomy. Modified radical mastectomy. This surgery is the same as a simple mastectomy but also includes removing lymph nodes from under the arm (axillary lymph node dissection). Radical mastectomy. In this surgery, the entire breast, the lymph nodes under the arm, and the chest wall muscles under the breast are removed. This surgery is rarely done now. A modified radical mastectomy is preferred because it is just as effective but with the added advantage of fewer complications. What are some advantages and disadvantages of these surgeries? Partial mastectomy Advantages Keeping most of your breast tissue intact, allowing for a more natural look to the breast. Easier recovery when compared to a mastectomy. Ability to go home on the day of the procedure. Disadvantages Slightly higher risk that your cancer will come back. Needing more surgery at a later time. Requiring radiation therapy after surgery, which has side effects and possible complications. This is done to reduce the chances of breast cancer returning. Mastectomy Advantages Not needing to have radiation therapy or other treatments  after surgery. Lower chances of your cancer coming back. Disadvantages Longer recovery time compared to partial mastectomy. Possibility of more complications. Requiring additional surgeries to reconstruct your breast. Questions to  ask Here are some questions to ask about each surgery: What will my recovery be like? How will my breast look and feel? What are the possible risks and complications of the surgery? What additional treatment might I need after surgery? What are the risks and complications of radiation therapy? What are the risks and complications of chemotherapy? Will I be able to have breast reconstruction? Where to find more information National Cancer Institute: cancer.gov American Cancer Society: cancer.org Summary Surgery is usually the first treatment for early-stage breast cancer. Two surgery options are available. One surgery option is called partial mastectomy and the other is called mastectomy. Both result in good rates of cancer survival. Each option has advantages and disadvantages to consider. The best treatment for one person might not be the best treatment for you. Learn as much as you can about your cancer, and work closely with your health care providers to make the choice that produces the best results for you. This information is not intended to replace advice given to you by your health care provider. Make sure you discuss any questions you have with your health care provider. Document Revised: 09/12/2021 Document Reviewed: 09/12/2021 Elsevier Patient Education  2024 [employer].

## 2023-12-25 NOTE — Progress Notes (Unsigned)
 [facility] History and Physical  Reason for Referral: DCIS  Referring Physician: Kara Pacer, NP   Chief Complaint   New Patient (Initial Visit)     Melissa Rich is a 73 y.o. female.  HPI:  Discussed the use of AI scribe software for clinical note transcription with the patient, who gave verbal consent to proceed.  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 She was referred by her mammogram provider after a biopsy confirmed DCIS.  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 The diagnosis followed a recent mammogram that revealed two small spots, which were biopsied. The spots are located in the right upper area of the breast, with one measuring 0.2 cm and the other 0.4 cm, separated by approximately 4 cm.  She has been receiving annual mammograms for over 35 years without any prior abnormalities or biopsies. This recent finding is the first abnormality noted in her mammogram history.  No nipple discharge, lumps, or bumps in her breasts. No swelling in her legs. She is not on any blood thinners.  Her family history is notable for a sister who had a breast biopsy but no breast cancer. She began menstruating at age 31 and has never been pregnant. No history of hot flashes.        Pathology:   1. Breast, right, needle core biopsy, upper inner quadrant, posterior (x clip) :       DUCTAL CARCINOMA IN SITU, HIGH NUCLEAR GRADE       NECROSIS: PRESENT       CALCIFICATIONS: PRESENT       DCIS LENGTH: 0.4 CM       SEE NOTE        2. Breast, right, needle core biopsy, upper inner quadrant, anterior (ribbon clip) :       DUCTAL CARCINOMA IN SITU, HIGH NUCLEAR GRADE       NECROSIS: PRESENT       CALCIFICATIONS: PRESENT       DCIS LENGTH: 0.2 CM       SEE NOTE   Past Medical History:  Diagnosis Date    Macular degeneration of both eyes    Medical history non-contributory     Past Surgical History:  Procedure Laterality Date   ABDOMINAL HYSTERECTOMY  1995   BREAST BIOPSY Right 12/10/2023   MM RT BREAST BX W LOC DEV EA AD LESION IMG BX SPEC STEREO GUIDE 12/10/2023 [facility]   BREAST BIOPSY Right 12/10/2023   MM RT BREAST BX W LOC DEV 1ST LESION IMAGE BX SPEC STEREO GUIDE 12/10/2023 [facility]   COLONOSCOPY N/A 07/20/2013   Procedure: COLONOSCOPY;  Surgeon: Dalia Heading, MD;  Location: [facility];  Service: Gastroenterology;  Laterality: N/A;   COLONOSCOPY WITH PROPOFOL N/A 07/17/2023   Procedure: COLONOSCOPY WITH PROPOFOL;  Surgeon: Corbin Ade, MD;  Location: [facility];  Service: Endoscopy;  Laterality: N/A;  915am, asa 2   POLYPECTOMY  07/17/2023   Procedure: POLYPECTOMY;  Surgeon: Corbin Ade, MD;  Location: [facility];  Service: Endoscopy;;    Family History  Problem Relation Age of Onset   Heart attack Father    Heart attack Mother    Cancer Other    Heart disease Other     Social History   Tobacco Use   Smoking  status: Never   Smokeless tobacco: Never  Vaping Use   Vaping status: Never Used  Substance Use Topics   Alcohol use: No   Drug use: No    Medications: I have reviewed the patient's current medications. Allergies as of 12/25/2023       Reactions   Codeine Swelling   Ankle Area        Medication List        Accurate as of December 25, 2023 11:59 PM. If you have any questions, ask your nurse or doctor.          alendronate 70 MG tablet Commonly known as: FOSAMAX Take 70 mg by mouth once a week. Take with a full glass of water on an empty stomach.   ascorbic acid 500 MG tablet Commonly known as: VITAMIN C Take 500 mg by mouth daily.   atorvastatin 10 MG tablet Commonly known as: LIPITOR Take 10 mg by mouth daily.   Calcium 1000 + D 1000-20 MG-MCG Tabs Generic drug: Calcium Carb-Cholecalciferol Take by  mouth.   Fish Oil 1000 MG Caps Take by mouth.   hydrochlorothiazide 12.5 MG tablet Commonly known as: HYDRODIURIL Take 12.5 mg by mouth daily.   losartan 50 MG tablet Commonly known as: COZAAR Take 50 mg by mouth daily.   OVER THE COUNTER MEDICATION D3 1000 iu         ROS:  A comprehensive review of systems was negative except for: Cardiovascular: positive for HTN Integument/breast: positive for breast cancer on mammogram  Blood pressure 130/80, pulse 79, temperature 98 F (36.7 C), temperature source Oral, resp. rate 12, height 5\' 3"  (1.6 m), weight 172 lb (78 kg), SpO2 98%.   Physical Exam   GENERAL: Alert, cooperative, well developed, no acute distress. HEENT: Normocephalic, normal oropharynx, moist mucous membranes. CHEST: Clear to auscultation bilaterally, no wheezes, rhonchi, or crackles. CARDIOVASCULAR: Normal heart rate and rhythm, S1 and S2 normal without murmurs. BREAST: No lumps or bumps on right and left breast, no palpable lymph nodes in axilla bilaterally. ABDOMEN: Soft, non-tender, non-distended, without organomegaly, normal bowel sounds. EXTREMITIES: No cyanosis or edema. NEUROLOGICAL: Cranial nerves grossly intact, moves all extremities without gross motor or sensory deficit.      Results:  ADDENDUM REPORT: 12/11/2023 10:58   ADDENDUM: Pathology revealed: DUCTAL CARCINOMA IN SITU, HIGH NUCLEAR GRADE, NECROSIS: PRESENT, CALCIFICATIONS: PRESENT of the RIGHT breast, upper inner quadrant, posterior, (X clip). This was found to be concordant by Dr. Quincy Carnes.   Pathology revealed: DUCTAL CARCINOMA IN SITU, HIGH NUCLEAR GRADE, NECROSIS: PRESENT, CALCIFICATIONS: PRESENT of the RIGHT breast, upper inner quadrant, anterior, (ribbon clip). This was found to be concordant by Dr. Quincy Carnes.   Pathology results were discussed with the patient by telephone. The patient reported doing well after the biopsies with tenderness at the sites. Post biopsy  instructions and care were reviewed and questions were answered. The patient was encouraged to call [facility] for any additional concerns. My direct phone number was provided.   Surgical consultation request was sent to [facility] in [place], [place], per patient request, via secure EPIC message on December 11, 2023.   Consider bilateral breast MRI for further evaluation of extent of disease given the high grade histology.   Pathology results reported by Rene Kocher, RN on 12/11/2023.     Electronically Signed   By: Hulan Saas M.D.   On: 12/11/2023 10:58   CLINICAL DATA:  73 year old with  screening detected indeterminate calcifications involving the UPPER INNER QUADRANT of the RIGHT breast at anterior to middle depth spanning approximately 5.2 cm. The posterior and anterior extent of the calcifications are sampled.   EXAM: RIGHT BREAST STEREOTACTIC CORE NEEDLE BIOPSY x 2   COMPARISON:  Previous exam(s).   FINDINGS: The patient and I discussed the procedure of stereotactic-guided biopsy including benefits and alternatives. We discussed the high likelihood of a successful procedure. We discussed the risks of the procedure including infection, bleeding, tissue injury, clip migration, and inadequate sampling. Informed written consent was given. The usual time out protocol was performed immediately prior to the procedure.   # 1) Posterior extent of calcifications, lesion quadrant: UPPER INNER QUADRANT.   Using sterile technique with chlorhexidine as skin antisepsis, 1% lidocaine and 1% lidocaine with epinephrine as local anesthetic, under stereotactic tomosynthesis guidance, a 9 gauge Brevera vacuum assisted device was used to initially perform core needle biopsy of the posterior extent of the calcifications in the UPPER INNER QUADRANT using a superior approach. Specimen radiograph was performed showing  calcifications in at least 3 of the 6 core samples, with microcalcifications suspected in the remaining samples. Specimens with calcifications are identified for pathology. At the conclusion of the procedure, an X shaped tissue marker clip was deployed into the biopsy cavity.   # 2) Anterior extent of calcifications, lesion quadrant: UPPER INNER QUADRANT.   Using sterile technique with chlorhexidine as skin antisepsis, 1% lidocaine and 1% lidocaine with epinephrine as local anesthetic, under stereotactic tomosynthesis guidance, a 9 gauge Brevera vacuum assisted device was used to subsequently perform core needle biopsy of the anterior extent of the calcifications in the UPPER INNER QUADRANT using a superior approach. Specimen radiograph was performed showing calcifications in at least 4 of the 7 core samples, with microcalcifications suspected in some of the remaining samples. Specimens with calcifications are identified for pathology. At the conclusion of the procedure, a ribbon shaped tissue marker clip was deployed into the biopsy cavity.   The patient tolerated the procedures well without apparent immediate complications. Follow-up 2-view mammogram was performed in order to confirm clip placement and was dictated separately.   IMPRESSION: Stereotactic tomosynthesis guided core needle biopsy (x 2) of the posterior and anterior extent of a 5.2 cm group of calcifications involving the UPPER INNER QUADRANT of the RIGHT breast at anterior to middle depth.   Electronically Signed: By: Hulan Saas M.D. On: 12/10/2023 12:29         Assessment & Plan:      Ductal Carcinoma In Situ (DCIS) High-grade DCIS in the right breast, hormone receptor-negative. Age and breast size considered in treatment. Surgical options include lumpectomy with radiation or mastectomy without radiation. Sentinel lymph node sampling recommended. - Discussed surgical options: lumpectomy with lymph  node sampling and radiation, or mastectomy with lymph node sampling without radiation. - Considered breast size and personal preferences in decision-making. - Scheduled radiotracer placement if lumpectomy chosen. - Plan to sample sentinel lymph nodes using Magtrace dye during surgery. - Explained surgical risks: bleeding, infection, potential need for additional surgery if lumpectomy chosen. - Discussed potential need for post-surgery treatments based on tissue and lymph node analysis. - Coordinated with oncologist Dr. Ellin Saba for treatment planning. He agrees she would need SLNB due to the high grade status, necrosis and her hormone receptor being negative.  - Provided written information on treatment options, encouraged family discussion, and scheduled follow-up next week.      We have discussed the options  for surgery including the option of mastectomy with sentinel node biopsy versus partial mastectomy (lumpectomy) with sentinel node biopsy. We have discussed that there is no difference in the prognosis or chance or recurrence or differences in survival between the two options. We have discussed the need for radiation with the lumpectomy, and we have discussed that she will be referred to oncology after our procedure to further discuss her options for chemotherapy and hormonal therapy if she qualifies.   We have discussed that if she decides to have a lumpectomy that we will need to get a needle placed into the area where the biopsy was performed, since we cannot palpate a mass. We have also discussed the need for injection of radiotracer and blue dye to perform the sentinel node biopsy.  We have discussed that the sentinel node biopsy tells [place] if the cancer has spread to the lymph nodes, and can help with plans for chemotherapy treatment and overall prognosis.    We have discussed that if the lumpectomy does not remove the entire cancer that she may have to have an additional procedure, and  we have discussed that a positive sentinel node can require further removal of lymph nodes from the axilla but that recent research does not show any improvement in disease free survival and carries greater risk for lymphedema.    We have discussed that these are big discussions, and that the risk from the operations are similar including risk of bleeding, risk of infection, and risk of needing additional surgeries. We have discussed the likely need for an overnight stay with a mastectomy and a drain that will remain in place for about 1 week.    She will return next week to decide what she wants to pursue.      Lucretia Roers 12/26/2023, 8:45 AM

## 2023-12-31 ENCOUNTER — Encounter: Payer: Self-pay | Admitting: General Surgery

## 2023-12-31 ENCOUNTER — Ambulatory Visit: Admitting: General Surgery

## 2023-12-31 VITALS — BP 126/79 | HR 85 | Temp 98.4°F | Resp 12 | Ht 63.0 in | Wt 172.0 lb

## 2023-12-31 DIAGNOSIS — D0511 Intraductal carcinoma in situ of right breast: Secondary | ICD-10-CM | POA: Diagnosis not present

## 2023-12-31 NOTE — Progress Notes (Signed)
 [facility]  Following up about her R breast DCIS, high grade / hormone receptor negative. Discussed with Dr. Ellin Saba and he agreed on SLNB needed.  Patient deciding about mastectomy versus partial with radiofrequency tag placement.  She has opted for the total mastectomy and sentinel lymph node biopsy.  Discussed the plans for the surgery. We already discussed the risk. Discussed the post operative care and stay overnight and plan for JP drain placement.  Will get her scheduled for 01/14/24.   Algis Greenhouse, MD [facility] [address] [place], [place] [address] [phone] (office)

## 2024-01-04 NOTE — H&P (Signed)
 [facility] History and Physical   Reason for Referral: DCIS  Referring Physician: Kara Pacer, NP     Chief Complaint   New Patient (Initial Visit)        Melissa Rich is a 73 y.o. female.  HPI:  Discussed the use of AI scribe software for clinical note transcription with the patient, who gave verbal consent to proceed.   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 She was referred by her mammogram provider after a biopsy confirmed DCIS.   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 The diagnosis followed a recent mammogram that revealed two small spots, which were biopsied. The spots are located in the right upper area of the breast, with one measuring 0.2 cm and the other 0.4 cm, separated by approximately 4 cm.   She has been receiving annual mammograms for over 35 years without any prior abnormalities or biopsies. This recent finding is the first abnormality noted in her mammogram history.   No nipple discharge, lumps, or bumps in her breasts. No swelling in her legs. She is not on any blood thinners.   Her family history is notable for a sister who had a breast biopsy but no breast cancer. She began menstruating at age 29 and has never been pregnant. No history of hot flashes.           Pathology:   1. Breast, right, needle core biopsy, upper inner quadrant, posterior (x clip) :       DUCTAL CARCINOMA IN SITU, HIGH NUCLEAR GRADE       NECROSIS: PRESENT       CALCIFICATIONS: PRESENT       DCIS LENGTH: 0.4 CM       SEE NOTE        2. Breast, right, needle core biopsy, upper inner quadrant, anterior (ribbon clip) :       DUCTAL CARCINOMA IN SITU, HIGH NUCLEAR GRADE       NECROSIS: PRESENT       CALCIFICATIONS: PRESENT       DCIS LENGTH: 0.2 CM       SEE NOTE        Past Medical  History:  Diagnosis Date   Macular degeneration of both eyes     Medical history non-contributory                 Past Surgical History:  Procedure Laterality Date   ABDOMINAL HYSTERECTOMY   1995   BREAST BIOPSY Right 12/10/2023    MM RT BREAST BX W LOC DEV EA AD LESION IMG BX SPEC STEREO GUIDE 12/10/2023 [facility]   BREAST BIOPSY Right 12/10/2023    MM RT BREAST BX W LOC DEV 1ST LESION IMAGE BX SPEC STEREO GUIDE 12/10/2023 [facility]   COLONOSCOPY N/A 07/20/2013    Procedure: COLONOSCOPY;  Surgeon: Dalia Heading, MD;  Location: [facility];  Service: Gastroenterology;  Laterality: N/A;   COLONOSCOPY WITH PROPOFOL N/A 07/17/2023    Procedure: COLONOSCOPY WITH PROPOFOL;  Surgeon: Corbin Ade, MD;  Location: [facility];  Service: Endoscopy;  Laterality: N/A;  915am, asa 2   POLYPECTOMY   07/17/2023    Procedure: POLYPECTOMY;  Surgeon: Corbin Ade, MD;  Location: [facility];  Service: Endoscopy;;  Family History  Problem Relation Age of Onset   Heart attack Father     Heart attack Mother     Cancer Other     Heart disease Other            Social History  Social History        Tobacco Use   Smoking status: Never   Smokeless tobacco: Never  Vaping Use   Vaping status: Never Used  Substance Use Topics   Alcohol use: No   Drug use: No        Medications: I have reviewed the patient's current medications. Allergies as of 12/25/2023         Reactions    Codeine Swelling    Ankle Area            Medication List           Accurate as of December 25, 2023 11:59 PM. If you have any questions, ask your nurse or doctor.              alendronate 70 MG tablet Commonly known as: FOSAMAX Take 70 mg by mouth once a week. Take with a full glass of water on an empty stomach.    ascorbic acid 500 MG tablet Commonly known as: VITAMIN C Take 500 mg by mouth daily.    atorvastatin 10 MG tablet Commonly known as: LIPITOR Take  10 mg by mouth daily.    Calcium 1000 + D 1000-20 MG-MCG Tabs Generic drug: Calcium Carb-Cholecalciferol Take by mouth.    Fish Oil 1000 MG Caps Take by mouth.    hydrochlorothiazide 12.5 MG tablet Commonly known as: HYDRODIURIL Take 12.5 mg by mouth daily.    losartan 50 MG tablet Commonly known as: COZAAR Take 50 mg by mouth daily.    OVER THE COUNTER MEDICATION D3 1000 iu               ROS:  A comprehensive review of systems was negative except for: Cardiovascular: positive for HTN Integument/breast: positive for breast cancer on mammogram   Blood pressure 130/80, pulse 79, temperature 98 F (36.7 C), temperature source Oral, resp. rate 12, height 5\' 3"  (1.6 m), weight 172 lb (78 kg), SpO2 98%.     Physical Exam   GENERAL: Alert, cooperative, well developed, no acute distress. HEENT: Normocephalic, normal oropharynx, moist mucous membranes. CHEST: Clear to auscultation bilaterally, no wheezes, rhonchi, or crackles. CARDIOVASCULAR: Normal heart rate and rhythm, S1 and S2 normal without murmurs. BREAST: No lumps or bumps on right and left breast, no palpable lymph nodes in axilla bilaterally. ABDOMEN: Soft, non-tender, non-distended, without organomegaly, normal bowel sounds. EXTREMITIES: No cyanosis or edema. NEUROLOGICAL: Cranial nerves grossly intact, moves all extremities without gross motor or sensory deficit.      Results:   ADDENDUM REPORT: 12/11/2023 10:58   ADDENDUM: Pathology revealed: DUCTAL CARCINOMA IN SITU, HIGH NUCLEAR GRADE, NECROSIS: PRESENT, CALCIFICATIONS: PRESENT of the RIGHT breast, upper inner quadrant, posterior, (X clip). This was found to be concordant by Dr. Quincy Carnes.   Pathology revealed: DUCTAL CARCINOMA IN SITU, HIGH NUCLEAR GRADE, NECROSIS: PRESENT, CALCIFICATIONS: PRESENT of the RIGHT breast, upper inner quadrant, anterior, (ribbon clip). This was found to be concordant by Dr. Quincy Carnes.   Pathology results were  discussed with the patient by telephone. The patient reported doing well after the biopsies with tenderness at the sites. Post biopsy instructions and care were reviewed and questions were answered. The patient was encouraged to call  [facility] for any additional concerns. My direct phone number was provided.   Surgical consultation request was sent to [facility] in [place], [place], per patient request, via secure EPIC message on December 11, 2023.   Consider bilateral breast MRI for further evaluation of extent of disease given the high grade histology.   Pathology results reported by Rene Kocher, RN on 12/11/2023.     Electronically Signed   By: Hulan Saas M.D.   On: 12/11/2023 10:58   CLINICAL DATA:  73 year old with screening detected indeterminate calcifications involving the UPPER INNER QUADRANT of the RIGHT breast at anterior to middle depth spanning approximately 5.2 cm. The posterior and anterior extent of the calcifications are sampled.   EXAM: RIGHT BREAST STEREOTACTIC CORE NEEDLE BIOPSY x 2   COMPARISON:  Previous exam(s).   FINDINGS: The patient and I discussed the procedure of stereotactic-guided biopsy including benefits and alternatives. We discussed the high likelihood of a successful procedure. We discussed the risks of the procedure including infection, bleeding, tissue injury, clip migration, and inadequate sampling. Informed written consent was given. The usual time out protocol was performed immediately prior to the procedure.   # 1) Posterior extent of calcifications, lesion quadrant: UPPER INNER QUADRANT.   Using sterile technique with chlorhexidine as skin antisepsis, 1% lidocaine and 1% lidocaine with epinephrine as local anesthetic, under stereotactic tomosynthesis guidance, a 9 gauge Brevera vacuum assisted device was used to initially perform core needle biopsy of the posterior  extent of the calcifications in the UPPER INNER QUADRANT using a superior approach. Specimen radiograph was performed showing calcifications in at least 3 of the 6 core samples, with microcalcifications suspected in the remaining samples. Specimens with calcifications are identified for pathology. At the conclusion of the procedure, an X shaped tissue marker clip was deployed into the biopsy cavity.   # 2) Anterior extent of calcifications, lesion quadrant: UPPER INNER QUADRANT.   Using sterile technique with chlorhexidine as skin antisepsis, 1% lidocaine and 1% lidocaine with epinephrine as local anesthetic, under stereotactic tomosynthesis guidance, a 9 gauge Brevera vacuum assisted device was used to subsequently perform core needle biopsy of the anterior extent of the calcifications in the UPPER INNER QUADRANT using a superior approach. Specimen radiograph was performed showing calcifications in at least 4 of the 7 core samples, with microcalcifications suspected in some of the remaining samples. Specimens with calcifications are identified for pathology. At the conclusion of the procedure, a ribbon shaped tissue marker clip was deployed into the biopsy cavity.   The patient tolerated the procedures well without apparent immediate complications. Follow-up 2-view mammogram was performed in order to confirm clip placement and was dictated separately.   IMPRESSION: Stereotactic tomosynthesis guided core needle biopsy (x 2) of the posterior and anterior extent of a 5.2 cm group of calcifications involving the UPPER INNER QUADRANT of the RIGHT breast at anterior to middle depth.   Electronically Signed: By: Hulan Saas M.D. On: 12/10/2023 12:29            Assessment & Plan:       Ductal Carcinoma In Situ (DCIS) High-grade DCIS in the right breast, hormone receptor-negative. Age and breast size considered in treatment. Surgical options include lumpectomy with radiation  or mastectomy without radiation. Sentinel lymph node sampling recommended. - Discussed surgical options: lumpectomy with lymph node sampling and radiation, or mastectomy with lymph node sampling without radiation. - Considered breast size and personal preferences in decision-making. -  Scheduled radiotracer placement if lumpectomy chosen. - Plan to sample sentinel lymph nodes using Magtrace dye during surgery. - Explained surgical risks: bleeding, infection, potential need for additional surgery if lumpectomy chosen. - Discussed potential need for post-surgery treatments based on tissue and lymph node analysis. - Coordinated with oncologist Dr. Ellin Saba for treatment planning. He agrees she would need SLNB due to the high grade status, necrosis and her hormone receptor being negative.  - Provided written information on treatment options, encouraged family discussion, and scheduled follow-up next week.       We have discussed the options for surgery including the option of mastectomy with sentinel node biopsy versus partial mastectomy (lumpectomy) with sentinel node biopsy. We have discussed that there is no difference in the prognosis or chance or recurrence or differences in survival between the two options. We have discussed the need for radiation with the lumpectomy, and we have discussed that she will be referred to oncology after our procedure to further discuss her options for chemotherapy and hormonal therapy if she qualifies.    We have discussed that if she decides to have a lumpectomy that we will need to get a needle placed into the area where the biopsy was performed, since we cannot palpate a mass. We have also discussed the need for injection of radiotracer and blue dye to perform the sentinel node biopsy.   We have discussed that the sentinel node biopsy tells [place] if the cancer has spread to the lymph nodes, and can help with plans for chemotherapy treatment and overall prognosis.      We have discussed that if the lumpectomy does not remove the entire cancer that she may have to have an additional procedure, and we have discussed that a positive sentinel node can require further removal of lymph nodes from the axilla but that recent research does not show any improvement in disease free survival and carries greater risk for lymphedema.     We have discussed that these are big discussions, and that the risk from the operations are similar including risk of bleeding, risk of infection, and risk of needing additional surgeries. We have discussed the likely need for an overnight stay with a mastectomy and a drain that will remain in place for about 1 week.     She will return next week to decide what she wants to pursue.         Melissa Rich 12/26/2023, 8:45 AM     Follow up on on 12/31/2023-  Following up about her R breast DCIS, high grade / hormone receptor negative. Discussed with Dr. Ellin Saba and he agreed on SLNB needed.   Patient deciding about mastectomy versus partial with radiofrequency tag placement.   She has opted for the total mastectomy and sentinel lymph node biopsy.  Discussed the plans for the surgery. We already discussed the risk. Discussed the post operative care and stay overnight and plan for JP drain placement.   Will get her scheduled for 01/14/24.    Melissa Greenhouse, MD [facility] [address] [place], [place] [address] [phone] (office)

## 2024-01-12 ENCOUNTER — Telehealth: Payer: Self-pay | Admitting: *Deleted

## 2024-01-12 NOTE — Telephone Encounter (Signed)
 Surgical Date: 01/14/2024 Procedure: Simple Mastectomy, Sentinel Lymph Node Biopsy  Received call from patient [phone]~ telephone.   Reports that with the change in weather, she is having increased sinus pressure and nasal congestion. Inquired if she can take Claritin. Advised that she can take Claritin, but to notify office if she notes fever/ chills, or if she starts ABTx or prednisone.   Agreeable to plan.

## 2024-01-12 NOTE — Patient Instructions (Signed)
 Melissa Rich  01/12/2024     @PREFPERIOPPHARMACY @   Your procedure is scheduled on  01/14/2024.   Report to [facility] at  0700  A.M.   Call this number if you have problems the morning of surgery:  [phone]  If you experience any cold or flu symptoms such as cough, fever, chills, shortness of breath, etc. between now and your scheduled surgery, please notify us at the above number.   Remember:  Do not eat after midnight.   You may drink clear liquids until  0500 am on 01/14/2024.    Clear liquids allowed are:                    Water, Juice (No red color; non-citric and without pulp; diabetics please choose diet or no sugar options), Carbonated beverages (diabetics please choose diet or no sugar options), Clear Tea (No creamer, milk, or cream, including half & half and powdered creamer), Black Coffee Only (No creamer, milk or cream, including half & half and powdered creamer), and Clear Sports drink (No red color; diabetics please choose diet or no sugar options)    Take these medicines the morning of surgery with A SIP OF WATER                                                              None.    Do not wear jewelry, make-up or nail polish, including gel polish,  artificial nails, or any other type of covering on natural nails (fingers and  toes).  Do not wear lotions, powders, or perfumes, or deodorant.  Do not shave 48 hours prior to surgery.  Men may shave face and neck.  Do not bring valuables to the hospital.  [facility] is not responsible for any belongings or valuables.  Contacts, dentures or bridgework may not be worn into surgery.  Leave your suitcase in the car.  After surgery it may be brought to your room.  For patients admitted to the hospital, discharge time will be determined by your treatment team.  Patients discharged the day of surgery will not be allowed to drive home and must have someone with them for 24 hours.    Special instructions:   DO  NOT smoke tobacco or vape for 24 hours before your procedure.  Please read over the following fact sheets that you were given. Coughing and Deep Breathing, Surgical Site Infection Prevention, Anesthesia Post-op Instructions, and Care and Recovery After Surgery        Sentinel Lymph Node Biopsy in Breast Cancer Treatment, Care After The following information offers guidance on how to care for yourself after your procedure. Your health care provider may also give you more specific instructions. If you have problems or questions, contact your health care provider. What can I expect after the procedure? After the procedure, it is common to have: Blue urine and darker stool for the next 24 hours. This is caused by the dye that was used during the procedure and is normal. Blue skin at the injection site. This may last for up to 8 weeks. Numbness, tingling, or pain near your incision. Swelling or bruising near your incision. Follow these instructions at home: Activity Avoid activities that take a lot of  effort. Return to your normal activities as told by your health care provider. Ask your health care provider what activities are safe for you. Incision care  Follow instructions from your health care provider about how to take care of your incision. Make sure you: Wash your hands with soap and water for at least 20 seconds before and after you change your bandage (dressing). If soap and water are not available, use hand sanitizer. Change your dressing as told by your health care provider. Leave stitches (sutures), metal clips, skin glue, or adhesive strips in place. These skin closures may need to stay in place for 2 weeks or longer. If adhesive strip edges start to loosen and curl up, you may trim the loose edges. Do not remove adhesive strips completely unless your health care provider tells you to do that. Do not take baths, swim, or use a hot tub until your health care provider approves. Ask  your health care provider if you may take showers. You may be able to shower 24 hours after your procedure. Check your biopsy site every day for signs of infection. Check for: More redness, swelling, or pain. Fluid or blood. Warmth. Pus or a bad smell. General instructions If you were given a surgical bra, wear it for the next 48 hours. You may remove the bra to shower. Take over-the-counter and prescription medicines only as told by your health care provider. You may resume your regular diet. Do not have your blood pressure taken or have blood drawn from the arm on the side of the biopsy until your health care provider says it is okay. You may need to be screened for extra fluid around the lymph nodes and swelling in the breast and arm (lymphedema). Follow instructions from your health care provider about how often you should be checked. Keep all follow-up visits. This is important. Contact a health care provider if you have: Nausea and vomiting. Any of these signs of infection: More redness, swelling, or pain around your biopsy site. Fluid or blood coming from your incision. Warmth coming from your incision area. Pus or a bad smell coming from your incision. Any new bruising. Chills or a fever. Get help right away if you have: Pain that is getting worse and your medicine is not helping. Vomiting that will not stop. Chest pain or trouble breathing. These symptoms may be an emergency. Get help right away. Call 911. Do not wait to see if the symptoms will go away. Do not drive yourself to the hospital. Summary After the procedure, it is common to have blue urine and darker stool for the next 24 hours. This is normal. It is caused by the dye that was used during the procedure. Follow instructions from your health care provider about how to take care of your incision. Do not have your blood pressure taken or have blood drawn from the arm on the side of the biopsy until your health care  provider says it is okay. You may need to be screened for extra fluid around the lymph nodes and swelling in the breast and arm (lymphedema). Follow instructions from your health care provider about how often you should be checked. This information is not intended to replace advice given to you by your health care provider. Make sure you discuss any questions you have with your health care provider. Document Revised: 09/12/2021 Document Reviewed: 09/12/2021 Elsevier Patient Education  2024 [employer]General Anesthesia, Adult, Care After The following information offers guidance on how  to care for yourself after your procedure. Your health care provider may also give you more specific instructions. If you have problems or questions, contact your health care provider. What can I expect after the procedure? After the procedure, it is common for people to: Have pain or discomfort at the IV site. Have nausea or vomiting. Have a sore throat or hoarseness. Have trouble concentrating. Feel cold or chills. Feel weak, sleepy, or tired (fatigue). Have soreness and body aches. These can affect parts of the body that were not involved in surgery. Follow these instructions at home: For the time period you were told by your health care provider:  Rest. Do not participate in activities where you could fall or become injured. Do not drive or use machinery. Do not drink alcohol. Do not take sleeping pills or medicines that cause drowsiness. Do not make important decisions or sign legal documents. Do not take care of children on your own. General instructions Drink enough fluid to keep your urine pale yellow. If you have sleep apnea, surgery and certain medicines can increase your risk for breathing problems. Follow instructions from your health care provider about wearing your sleep device: Anytime you are sleeping, including during daytime naps. While taking prescription pain medicines, sleeping  medicines, or medicines that make you drowsy. Return to your normal activities as told by your health care provider. Ask your health care provider what activities are safe for you. Take over-the-counter and prescription medicines only as told by your health care provider. Do not use any products that contain nicotine or tobacco. These products include cigarettes, chewing tobacco, and vaping devices, such as e-cigarettes. These can delay incision healing after surgery. If you need help quitting, ask your health care provider. Contact a health care provider if: You have nausea or vomiting that does not get better with medicine. You vomit every time you eat or drink. You have pain that does not get better with medicine. You cannot urinate or have bloody urine. You develop a skin rash. You have a fever. Get help right away if: You have trouble breathing. You have chest pain. You vomit blood. These symptoms may be an emergency. Get help right away. Call 911. Do not wait to see if the symptoms will go away. Do not drive yourself to the hospital. Summary After the procedure, it is common to have a sore throat, hoarseness, nausea, vomiting, or to feel weak, sleepy, or fatigue. For the time period you were told by your health care provider, do not drive or use machinery. Get help right away if you have difficulty breathing, have chest pain, or vomit blood. These symptoms may be an emergency. This information is not intended to replace advice given to you by your health care provider. Make sure you discuss any questions you have with your health care provider. Document Revised: 12/28/2021 Document Reviewed: 12/28/2021 Elsevier Patient Education  2024 [employer]. How to Use an Incentive Spirometer An incentive spirometer is a tool that measures how well you are filling your lungs with each breath. Learning to take long, deep breaths using this tool can help you keep your lungs clear and active.  This may help to reverse or lessen your chance of developing breathing (pulmonary) problems, especially infection. You may be asked to use a spirometer: After a surgery. If you have a lung problem or a history of smoking. After a long period of time when you have been unable to move or be active. If the spirometer  includes an indicator to show the highest number that you have reached, your health care provider or respiratory therapist will help you set a goal. Keep a log of your progress as told by your health care provider. What are the risks? Breathing too quickly may cause dizziness or cause you to pass out. Take your time so you do not get dizzy or light-headed. If you are in pain, you may need to take pain medicine before doing incentive spirometry. It is harder to take a deep breath if you are having pain. How to use your incentive spirometer  Sit up on the edge of your bed or on a chair. Hold the incentive spirometer so that it is in an upright position. Before you use the spirometer, breathe out normally. Place the mouthpiece in your mouth. Make sure your lips are closed tightly around it. Breathe in slowly and as deeply as you can through your mouth, causing the piston or the ball to rise toward the top of the chamber. Hold your breath for 3-5 seconds, or for as long as possible. If the spirometer includes a coach indicator, use this to guide you in breathing. Slow down your breathing if the indicator goes above the marked areas. Remove the mouthpiece from your mouth and breathe out normally. The piston or ball will return to the bottom of the chamber. Rest for a few seconds, then repeat the steps 10 or more times. Take your time and take a few normal breaths between deep breaths so that you do not get dizzy or light-headed. Do this every 1-2 hours when you are awake. If the spirometer includes a goal marker to show the highest number you have reached (best effort), use this as a goal  to work toward during each repetition. After each set of 10 deep breaths, cough a few times. This will help to make sure that your lungs are clear. If you have an incision on your chest or abdomen from surgery, place a pillow or a rolled-up towel firmly against the incision when you cough. This can help to reduce pain while taking deep breaths and coughing. General tips When you are able to get out of bed: Walk around often. Continue to take deep breaths and cough in order to clear your lungs. Keep using the incentive spirometer until your health care provider says it is okay to stop using it. If you have been in the hospital, you may be told to keep using the spirometer at home. Contact a health care provider if: You are having difficulty using the spirometer. You have trouble using the spirometer as often as instructed. Your pain medicine is not giving enough relief for you to use the spirometer as told. You have a fever. Get help right away if: You develop shortness of breath. You develop a cough with bloody mucus from the lungs. You have fluid or blood coming from an incision site after you cough. Summary An incentive spirometer is a tool that can help you learn to take long, deep breaths to keep your lungs clear and active. You may be asked to use a spirometer after a surgery, if you have a lung problem or a history of smoking, or if you have been inactive for a long period of time. Use your incentive spirometer as instructed every 1-2 hours while you are awake. If you have an incision on your chest or abdomen, place a pillow or a rolled-up towel firmly against your incision when you cough.  This will help to reduce pain. Get help right away if you have shortness of breath, you cough up bloody mucus, or blood comes from your incision when you cough. This information is not intended to replace advice given to you by your health care provider. Make sure you discuss any questions you have  with your health care provider. Document Revised: 08/08/2023 Document Reviewed: 08/08/2023 Elsevier Patient Education  2024 [employer].

## 2024-01-13 ENCOUNTER — Encounter (HOSPITAL_COMMUNITY): Payer: Self-pay

## 2024-01-13 ENCOUNTER — Encounter (HOSPITAL_COMMUNITY)
Admission: RE | Admit: 2024-01-13 | Discharge: 2024-01-13 | Disposition: A | Discharge status: Home / Self Care | Source: Ambulatory Visit | Attending: General Surgery | Admitting: General Surgery

## 2024-01-13 VITALS — BP 126/79 | HR 85 | Temp 98.4°F | Resp 18 | Ht 63.0 in | Wt 172.0 lb

## 2024-01-13 DIAGNOSIS — Z79899 Other long term (current) drug therapy: Secondary | ICD-10-CM | POA: Insufficient documentation

## 2024-01-13 DIAGNOSIS — I10 Essential (primary) hypertension: Secondary | ICD-10-CM | POA: Insufficient documentation

## 2024-01-13 DIAGNOSIS — Z01818 Encounter for other preprocedural examination: Secondary | ICD-10-CM | POA: Insufficient documentation

## 2024-01-13 HISTORY — DX: Essential (primary) hypertension: I10

## 2024-01-13 LAB — BASIC METABOLIC PANEL WITH GFR
Anion gap: 9 (ref 5–15)
BUN: 11 mg/dL (ref 8–23)
CO2: 28 mmol/L (ref 22–32)
Calcium: 9.3 mg/dL (ref 8.9–10.3)
Chloride: 101 mmol/L (ref 98–111)
Creatinine, Ser: 0.76 mg/dL (ref 0.44–1.00)
GFR, Estimated: >60 mL/min (ref >60)
Glucose, Bld: 95 mg/dL (ref 70–99)
Potassium: 3.5 mmol/L (ref 3.5–5.1)
Sodium: 138 mmol/L (ref 135–145)

## 2024-01-13 NOTE — Anesthesia Preprocedure Evaluation (Signed)
 Anesthesia Evaluation  Patient identified by MRN, date of birth, ID band Patient awake    Reviewed: Allergy & Precautions, H&P , NPO status , Patient's Chart, lab work & pertinent test results, reviewed documented beta blocker date and time   Airway Mallampati: II  TM Distance: >3 FB Neck ROM: full    Dental no notable dental hx. (+) Dental Advisory Given, Teeth Intact   Pulmonary neg pulmonary ROS   Pulmonary exam normal breath sounds clear to auscultation       Cardiovascular Exercise Tolerance: Good hypertension, Normal cardiovascular exam Rhythm:regular Rate:Normal     Neuro/Psych Macular deg  negative psych ROS   GI/Hepatic negative GI ROS, Neg liver ROS,,,  Endo/Other  negative endocrine ROS    Renal/GU negative Renal ROS  negative genitourinary   Musculoskeletal   Abdominal   Peds  Hematology negative hematology ROS (+)   Anesthesia Other Findings   Reproductive/Obstetrics negative OB ROS                             Anesthesia Physical Anesthesia Plan  ASA: 2  Anesthesia Plan: General   Post-op Pain Management: Dilaudid IV   Induction: Intravenous  PONV Risk Score and Plan: Ondansetron, Dexamethasone and Midazolam  Airway Management Planned: LMA  Additional Equipment: None  Intra-op Plan:   Post-operative Plan: Extubation in OR  Informed Consent: I have reviewed the patients History and Physical, chart, labs and discussed the procedure including the risks, benefits and alternatives for the proposed anesthesia with the patient or authorized representative who has indicated his/her understanding and acceptance.     Dental Advisory Given  Plan Discussed with: CRNA and Surgeon  Anesthesia Plan Comments:        Anesthesia Quick Evaluation

## 2024-01-14 ENCOUNTER — Ambulatory Visit (HOSPITAL_BASED_OUTPATIENT_CLINIC_OR_DEPARTMENT_OTHER): Admitting: Certified Registered"

## 2024-01-14 ENCOUNTER — Observation Stay (HOSPITAL_COMMUNITY)
Admission: RE | Admit: 2024-01-14 | Discharge: 2024-01-16 | Disposition: A | Discharge status: Home / Self Care | Attending: General Surgery | Admitting: General Surgery

## 2024-01-14 ENCOUNTER — Ambulatory Visit (HOSPITAL_COMMUNITY): Admitting: Certified Registered"

## 2024-01-14 ENCOUNTER — Other Ambulatory Visit: Payer: Self-pay

## 2024-01-14 ENCOUNTER — Encounter (HOSPITAL_COMMUNITY)
Admission: RE | Disposition: A | Discharge status: Home / Self Care | Payer: Self-pay | Source: Home / Self Care | Attending: General Surgery

## 2024-01-14 ENCOUNTER — Encounter (HOSPITAL_COMMUNITY): Payer: Self-pay | Admitting: General Surgery

## 2024-01-14 DIAGNOSIS — E871 Hypo-osmolality and hyponatremia: Secondary | ICD-10-CM | POA: Insufficient documentation

## 2024-01-14 DIAGNOSIS — D62 Acute posthemorrhagic anemia: Secondary | ICD-10-CM | POA: Diagnosis not present

## 2024-01-14 DIAGNOSIS — R2681 Unsteadiness on feet: Secondary | ICD-10-CM | POA: Insufficient documentation

## 2024-01-14 DIAGNOSIS — D0511 Intraductal carcinoma in situ of right breast: Secondary | ICD-10-CM | POA: Diagnosis present

## 2024-01-14 SURGERY — MASTECTOMY, SIMPLE
Anesthesia: General | Site: Breast | Laterality: Right

## 2024-01-14 MED ORDER — OXYCODONE HCL 5 MG PO TABS
5.0000 mg | ORAL_TABLET | ORAL | Status: DC | PRN
Start: 2024-01-14 — End: 2024-01-16
  Administered 2024-01-15: 5 mg via ORAL
  Filled 2024-01-14: qty 1

## 2024-01-14 MED ORDER — ROCURONIUM BROMIDE 10 MG/ML (PF) SYRINGE
PREFILLED_SYRINGE | INTRAVENOUS | Status: AC
  Filled 2024-01-14: qty 10

## 2024-01-14 MED ORDER — PANTOPRAZOLE SODIUM 40 MG IV SOLR
40.0000 mg | Freq: Every day | INTRAVENOUS | Status: DC
  Administered 2024-01-14 – 2024-01-15 (×2): 40 mg via INTRAVENOUS
  Filled 2024-01-14 (×2): qty 10

## 2024-01-14 MED ORDER — ONDANSETRON HCL 4 MG/2ML IJ SOLN
INTRAMUSCULAR | Status: AC
  Filled 2024-01-14: qty 2

## 2024-01-14 MED ORDER — CHLORHEXIDINE GLUCONATE CLOTH 2 % EX PADS
6.0000 | MEDICATED_PAD | Freq: Once | CUTANEOUS | Status: DC
Start: 2024-01-14 — End: 2024-01-14

## 2024-01-14 MED ORDER — PROPOFOL 500 MG/50ML IV EMUL
INTRAVENOUS | Status: AC
  Filled 2024-01-14: qty 50

## 2024-01-14 MED ORDER — EPHEDRINE 5 MG/ML INJ
INTRAVENOUS | Status: AC
  Filled 2024-01-14: qty 5

## 2024-01-14 MED ORDER — LIDOCAINE HCL (PF) 2 % IJ SOLN
INTRAMUSCULAR | Status: DC | PRN
Start: 2024-01-14 — End: 2024-01-14
  Administered 2024-01-14: 100 mg via INTRADERMAL

## 2024-01-14 MED ORDER — DIPHENHYDRAMINE HCL 12.5 MG/5ML PO ELIX: 12.5000 mg | ORAL_SOLUTION | Freq: Four times a day (QID) | ORAL | Status: DC | PRN

## 2024-01-14 MED ORDER — MAGTRACE LYMPHATIC TRACER
INTRAMUSCULAR | Status: DC | PRN
  Administered 2024-01-14: 2 mL via INTRAMUSCULAR

## 2024-01-14 MED ORDER — ROCURONIUM BROMIDE 10 MG/ML (PF) SYRINGE
PREFILLED_SYRINGE | INTRAVENOUS | Status: DC | PRN
  Administered 2024-01-14: 50 mg via INTRAVENOUS

## 2024-01-14 MED ORDER — ONDANSETRON HCL 4 MG/2ML IJ SOLN
INTRAMUSCULAR | Status: DC | PRN
Start: 2024-01-14 — End: 2024-01-14
  Administered 2024-01-14: 4 mg via INTRAVENOUS

## 2024-01-14 MED ORDER — ORAL CARE MOUTH RINSE: 15.0000 mL | Freq: Once | OROMUCOSAL | Status: AC

## 2024-01-14 MED ORDER — 0.9 % SODIUM CHLORIDE (POUR BTL) OPTIME
TOPICAL | Status: DC | PRN
  Administered 2024-01-14: 1000 mL

## 2024-01-14 MED ORDER — PHENYLEPHRINE 80 MCG/ML (10ML) SYRINGE FOR IV PUSH (FOR BLOOD PRESSURE SUPPORT)
PREFILLED_SYRINGE | INTRAVENOUS | Status: DC | PRN
Start: 2024-01-14 — End: 2024-01-14
  Administered 2024-01-14 (×6): 80 ug via INTRAVENOUS

## 2024-01-14 MED ORDER — MIDAZOLAM HCL 2 MG/2ML IJ SOLN
INTRAMUSCULAR | Status: AC
  Filled 2024-01-14: qty 2

## 2024-01-14 MED ORDER — LORATADINE 10 MG PO TABS: 10.0000 mg | ORAL_TABLET | Freq: Every day | ORAL | Status: DC | PRN

## 2024-01-14 MED ORDER — ACETAMINOPHEN 500 MG PO TABS
1000.0000 mg | ORAL_TABLET | Freq: Four times a day (QID) | ORAL | Status: DC
  Administered 2024-01-14 – 2024-01-16 (×8): 1000 mg via ORAL
  Filled 2024-01-14 (×8): qty 2

## 2024-01-14 MED ORDER — ATORVASTATIN CALCIUM 10 MG PO TABS
10.0000 mg | ORAL_TABLET | Freq: Every day | ORAL | Status: DC
  Administered 2024-01-15 – 2024-01-16 (×2): 10 mg via ORAL
  Filled 2024-01-14 (×2): qty 1

## 2024-01-14 MED ORDER — OXYCODONE HCL 5 MG PO TABS: 5.0000 mg | ORAL_TABLET | Freq: Once | ORAL | Status: DC | PRN

## 2024-01-14 MED ORDER — PHENYLEPHRINE 80 MCG/ML (10ML) SYRINGE FOR IV PUSH (FOR BLOOD PRESSURE SUPPORT)
PREFILLED_SYRINGE | INTRAVENOUS | Status: AC
  Filled 2024-01-14: qty 10

## 2024-01-14 MED ORDER — DEXMEDETOMIDINE HCL IN NACL 80 MCG/20ML IV SOLN
INTRAVENOUS | Status: DC | PRN
  Administered 2024-01-14 (×2): 8 ug via INTRAVENOUS

## 2024-01-14 MED ORDER — DOCUSATE SODIUM 100 MG PO CAPS
100.0000 mg | ORAL_CAPSULE | Freq: Two times a day (BID) | ORAL | Status: DC
  Administered 2024-01-14 – 2024-01-15 (×3): 100 mg via ORAL
  Filled 2024-01-14 (×4): qty 1

## 2024-01-14 MED ORDER — CEFAZOLIN SODIUM-DEXTROSE 2-4 GM/100ML-% IV SOLN
2.0000 g | INTRAVENOUS | Status: AC
  Administered 2024-01-14: 2 g via INTRAVENOUS
  Filled 2024-01-14: qty 100

## 2024-01-14 MED ORDER — CALCIUM CARBONATE 1250 (500 CA) MG PO TABS
1250.0000 mg | ORAL_TABLET | Freq: Every day | ORAL | Status: DC
  Administered 2024-01-15 – 2024-01-16 (×2): 1250 mg via ORAL
  Filled 2024-01-14 (×2): qty 1

## 2024-01-14 MED ORDER — SUGAMMADEX SODIUM 200 MG/2ML IV SOLN
INTRAVENOUS | Status: DC | PRN
  Administered 2024-01-14: 200 mg via INTRAVENOUS

## 2024-01-14 MED ORDER — LACTATED RINGERS IV SOLN: INTRAVENOUS | Status: DC

## 2024-01-14 MED ORDER — VITAMIN D 25 MCG (1000 UNIT) PO TABS
1000.0000 [IU] | ORAL_TABLET | Freq: Every day | ORAL | Status: DC
  Administered 2024-01-15 – 2024-01-16 (×2): 1000 [IU] via ORAL
  Filled 2024-01-14 (×2): qty 1

## 2024-01-14 MED ORDER — MORPHINE SULFATE (PF) 2 MG/ML IV SOLN: 2.0000 mg | INTRAVENOUS | Status: DC | PRN

## 2024-01-14 MED ORDER — MIDAZOLAM HCL 2 MG/2ML IJ SOLN
INTRAMUSCULAR | Status: DC | PRN
  Administered 2024-01-14 (×2): 1 mg via INTRAVENOUS

## 2024-01-14 MED ORDER — PROPOFOL 500 MG/50ML IV EMUL
INTRAVENOUS | Status: AC
Start: 2024-01-14 — End: ?
  Filled 2024-01-14: qty 50

## 2024-01-14 MED ORDER — SCOPOLAMINE 1 MG/3DAYS TD PT72
1.0000 | MEDICATED_PATCH | Freq: Once | TRANSDERMAL | Status: DC
  Administered 2024-01-14: 1.5 mg via TRANSDERMAL
  Filled 2024-01-14: qty 1

## 2024-01-14 MED ORDER — BUPIVACAINE HCL (PF) 0.5 % IJ SOLN
INTRAMUSCULAR | Status: AC
  Filled 2024-01-14: qty 30

## 2024-01-14 MED ORDER — FENTANYL CITRATE (PF) 100 MCG/2ML IJ SOLN
INTRAMUSCULAR | Status: DC | PRN
  Administered 2024-01-14 (×3): 50 ug via INTRAVENOUS

## 2024-01-14 MED ORDER — VITAMIN C 500 MG PO TABS
250.0000 mg | ORAL_TABLET | Freq: Every day | ORAL | Status: DC
  Administered 2024-01-15 – 2024-01-16 (×2): 250 mg via ORAL
  Filled 2024-01-14 (×2): qty 1

## 2024-01-14 MED ORDER — ONDANSETRON HCL 4 MG/2ML IJ SOLN: 4.0000 mg | Freq: Four times a day (QID) | INTRAMUSCULAR | Status: DC | PRN

## 2024-01-14 MED ORDER — SODIUM CHLORIDE 0.9 % IV SOLN: 12.5000 mg | INTRAVENOUS | Status: DC | PRN

## 2024-01-14 MED ORDER — CHLORHEXIDINE GLUCONATE 0.12 % MT SOLN
15.0000 mL | Freq: Once | OROMUCOSAL | Status: AC
  Administered 2024-01-14: 15 mL via OROMUCOSAL

## 2024-01-14 MED ORDER — FENTANYL CITRATE PF 50 MCG/ML IJ SOSY
25.0000 ug | PREFILLED_SYRINGE | INTRAMUSCULAR | Status: DC | PRN
  Administered 2024-01-14 (×2): 25 ug via INTRAVENOUS
  Filled 2024-01-14: qty 1

## 2024-01-14 MED ORDER — OMEGA-3-ACID ETHYL ESTERS 1 G PO CAPS
1.0000 g | ORAL_CAPSULE | Freq: Every day | ORAL | Status: DC
  Administered 2024-01-15 – 2024-01-16 (×2): 1 g via ORAL
  Filled 2024-01-14 (×2): qty 1

## 2024-01-14 MED ORDER — EPHEDRINE SULFATE-NACL 50-0.9 MG/10ML-% IV SOSY
PREFILLED_SYRINGE | INTRAVENOUS | Status: DC | PRN
  Administered 2024-01-14 (×3): 5 mg via INTRAVENOUS

## 2024-01-14 MED ORDER — DEXAMETHASONE SODIUM PHOSPHATE 10 MG/ML IJ SOLN
INTRAMUSCULAR | Status: DC | PRN
Start: 2024-01-14 — End: 2024-01-14
  Administered 2024-01-14: 5 mg via INTRAVENOUS

## 2024-01-14 MED ORDER — BUPIVACAINE HCL (PF) 0.5 % IJ SOLN
INTRAMUSCULAR | Status: DC | PRN
  Administered 2024-01-14: 30 mL

## 2024-01-14 MED ORDER — DIPHENHYDRAMINE HCL 50 MG/ML IJ SOLN: 12.5000 mg | Freq: Four times a day (QID) | INTRAMUSCULAR | Status: DC | PRN

## 2024-01-14 MED ORDER — PROPOFOL 10 MG/ML IV BOLUS
INTRAVENOUS | Status: AC
  Filled 2024-01-14: qty 20

## 2024-01-14 MED ORDER — DEXAMETHASONE SODIUM PHOSPHATE 10 MG/ML IJ SOLN
INTRAMUSCULAR | Status: AC
  Filled 2024-01-14: qty 1

## 2024-01-14 MED ORDER — KETOROLAC TROMETHAMINE 30 MG/ML IJ SOLN
INTRAMUSCULAR | Status: DC | PRN
  Administered 2024-01-14: 30 mg via INTRAVENOUS

## 2024-01-14 MED ORDER — ONDANSETRON 4 MG PO TBDP
4.0000 mg | ORAL_TABLET | Freq: Four times a day (QID) | ORAL | Status: DC | PRN
Start: 2024-01-14 — End: 2024-01-16

## 2024-01-14 MED ORDER — BACITRACIN ZINC 500 UNIT/GM EX OINT
TOPICAL_OINTMENT | CUTANEOUS | Status: AC
  Filled 2024-01-14: qty 0.9

## 2024-01-14 MED ORDER — LIDOCAINE HCL (PF) 2 % IJ SOLN
INTRAMUSCULAR | Status: AC
  Filled 2024-01-14: qty 5

## 2024-01-14 MED ORDER — HYDROCHLOROTHIAZIDE 12.5 MG PO TABS
12.5000 mg | ORAL_TABLET | Freq: Every day | ORAL | Status: DC
  Administered 2024-01-15 – 2024-01-16 (×2): 12.5 mg via ORAL
  Filled 2024-01-14 (×2): qty 1

## 2024-01-14 MED ORDER — DEXMEDETOMIDINE HCL IN NACL 80 MCG/20ML IV SOLN
INTRAVENOUS | Status: AC
  Filled 2024-01-14: qty 20

## 2024-01-14 MED ORDER — ACETAMINOPHEN 160 MG/5ML PO SOLN
960.0000 mg | Freq: Once | ORAL | Status: AC
  Filled 2024-01-14: qty 30

## 2024-01-14 MED ORDER — PROPOFOL 10 MG/ML IV BOLUS
INTRAVENOUS | Status: AC
Start: 2024-01-14 — End: ?
  Filled 2024-01-14: qty 20

## 2024-01-14 MED ORDER — FENTANYL CITRATE (PF) 100 MCG/2ML IJ SOLN
INTRAMUSCULAR | Status: AC
  Filled 2024-01-14: qty 2

## 2024-01-14 MED ORDER — PROPOFOL 500 MG/50ML IV EMUL
INTRAVENOUS | Status: DC | PRN
  Administered 2024-01-14: 25 ug/kg/min via INTRAVENOUS

## 2024-01-14 MED ORDER — KETOROLAC TROMETHAMINE 30 MG/ML IJ SOLN
INTRAMUSCULAR | Status: AC
  Filled 2024-01-14: qty 1

## 2024-01-14 MED ORDER — METOPROLOL TARTRATE 5 MG/5ML IV SOLN: 5.0000 mg | Freq: Four times a day (QID) | INTRAVENOUS | Status: DC | PRN

## 2024-01-14 MED ORDER — OXYCODONE HCL 5 MG/5ML PO SOLN: 5.0000 mg | Freq: Once | ORAL | Status: DC | PRN

## 2024-01-14 MED ORDER — PROPOFOL 10 MG/ML IV BOLUS
INTRAVENOUS | Status: DC | PRN
  Administered 2024-01-14: 150 mg via INTRAVENOUS

## 2024-01-14 MED ORDER — SIMETHICONE 80 MG PO CHEW: 40.0000 mg | CHEWABLE_TABLET | Freq: Four times a day (QID) | ORAL | Status: DC | PRN

## 2024-01-14 MED ORDER — ACETAMINOPHEN 500 MG PO TABS
1000.0000 mg | ORAL_TABLET | Freq: Once | ORAL | Status: AC
  Administered 2024-01-14: 1000 mg via ORAL
  Filled 2024-01-14: qty 2

## 2024-01-14 MED ORDER — LOSARTAN POTASSIUM 50 MG PO TABS
50.0000 mg | ORAL_TABLET | Freq: Every day | ORAL | Status: DC
  Administered 2024-01-15 – 2024-01-16 (×2): 50 mg via ORAL
  Filled 2024-01-14 (×2): qty 1

## 2024-01-14 MED ORDER — SUGAMMADEX SODIUM 200 MG/2ML IV SOLN
INTRAVENOUS | Status: AC
  Filled 2024-01-14: qty 2

## 2024-01-14 SURGICAL SUPPLY — 47 items
APPLIER CLIP 11 MED OPEN (CLIP) ×2 IMPLANT
BINDER BREAST XLRG (GAUZE/BANDAGES/DRESSINGS) IMPLANT
BLADE SURG 15 STRL LF DISP TIS (BLADE) ×2 IMPLANT
CHLORAPREP W/TINT 26 (MISCELLANEOUS) ×2 IMPLANT
CLIP APPLIE 11 MED OPEN (CLIP) IMPLANT
CLOTH BEACON ORANGE TIMEOUT ST (SAFETY) ×2 IMPLANT
COVER LIGHT HANDLE STERIS (MISCELLANEOUS) ×4 IMPLANT
COVER PROBE W GEL 5X96 (DRAPES) ×2 IMPLANT
DERMABOND ADVANCED .7 DNX12 (GAUZE/BANDAGES/DRESSINGS) ×2 IMPLANT
DEVICE DUBIN SPECIMEN MAMMOGRA (MISCELLANEOUS) ×2 IMPLANT
ELECT REM PT RETURN 9FT ADLT (ELECTROSURGICAL) ×2 IMPLANT
ELECTRODE REM PT RTRN 9FT ADLT (ELECTROSURGICAL) ×2 IMPLANT
EVACUATOR DRAINAGE 10X20 100CC (DRAIN) ×2 IMPLANT
FORCEPS ALLIS DISP 8 (DISPOSABLE) IMPLANT
GAUZE SPONGE 4X4 12PLY STRL (GAUZE/BANDAGES/DRESSINGS) ×2 IMPLANT
GLOVE BIO SURGEON STRL SZ 6.5 (GLOVE) ×2 IMPLANT
GLOVE BIOGEL PI IND STRL 6.5 (GLOVE) ×2 IMPLANT
GLOVE BIOGEL PI IND STRL 7.0 (GLOVE) ×4 IMPLANT
GOWN STRL REUS W/TWL LRG LVL3 (GOWN DISPOSABLE) ×6 IMPLANT
KIT TURNOVER KIT A (KITS) ×2 IMPLANT
MANIFOLD NEPTUNE II (INSTRUMENTS) ×2 IMPLANT
NDL HYPO 18GX1.5 BLUNT FILL (NEEDLE) ×2 IMPLANT
NDL HYPO 21X1.5 SAFETY (NEEDLE) ×2 IMPLANT
NDL HYPO 25X1 1.5 SAFETY (NEEDLE) ×4 IMPLANT
NEEDLE HYPO 18GX1.5 BLUNT FILL (NEEDLE) ×2 IMPLANT
NEEDLE HYPO 21X1.5 SAFETY (NEEDLE) ×2 IMPLANT
NEEDLE HYPO 25X1 1.5 SAFETY (NEEDLE) ×4 IMPLANT
NS IRRIG 1000ML POUR BTL (IV SOLUTION) ×2 IMPLANT
PACK MINOR (CUSTOM PROCEDURE TRAY) ×2 IMPLANT
PAD ABD 5X9 TENDERSORB (GAUZE/BANDAGES/DRESSINGS) IMPLANT
PAD ARMBOARD POSITIONER FOAM (MISCELLANEOUS) ×4 IMPLANT
PENCIL SMOKE EVACUATOR (MISCELLANEOUS) ×2 IMPLANT
POSITIONER HEAD 8X9X4 ADT (SOFTGOODS) ×2 IMPLANT
RETRACTOR HANDHELD DISP 8.5 (DISPOSABLE) IMPLANT
SET BASIN LINEN APH (SET/KITS/TRAYS/PACK) ×2 IMPLANT
SPONGE DRAIN TRACH 4X4 STRL 2S (GAUZE/BANDAGES/DRESSINGS) ×2 IMPLANT
SPONGE T-LAP 18X18 ~~LOC~~+RFID (SPONGE) ×4 IMPLANT
STAPLER VISISTAT (STAPLE) ×2 IMPLANT
SUT 3-0 BLK 1X30 PSL (SUTURE) ×2 IMPLANT
SUT MNCRL AB 4-0 PS2 18 (SUTURE) ×4 IMPLANT
SUT SILK 2 0 SH (SUTURE) ×2 IMPLANT
SUT VIC AB 2-0 CT1 TAPERPNT 27 (SUTURE) ×6 IMPLANT
SUT VIC AB 3-0 SH 27X BRD (SUTURE) ×4 IMPLANT
SYR 30ML LL (SYRINGE) ×2 IMPLANT
SYR BULB IRRIG 60ML STRL (SYRINGE) ×2 IMPLANT
SYR CONTROL 10ML LL (SYRINGE) ×4 IMPLANT
TRACER MAGTRACE VIAL (MISCELLANEOUS) IMPLANT

## 2024-01-14 NOTE — Progress Notes (Addendum)
 PT Cancellation Note  Patient Details Name: Melissa Rich MRN: [id number] DOB: [birth year]   Cancelled Treatment:    Reason Eval/Treat Not Completed: Medical issues which prohibited therapy  Attempted to see for PT evaluation on 01/14/24. Got EOB, pt report dizziness. BP: 192/102. Nursing notified. Will attempt later today or on later date.   12:27 PM, 01/14/24 Chryl Heck, PT, DPT [facility] [place]

## 2024-01-14 NOTE — Progress Notes (Signed)
 [facility]   Updated husband. Observation overnight for pain control, JP drain teaching, and physical therapy.  Pain meds ordered IS, OOB JP drain management Home meds Reg diet Labs in AM SCDs Physical therapy  Algis Greenhouse, MD [facility] [address] [place], [place] [address] [phone] (office)

## 2024-01-14 NOTE — Transfer of Care (Signed)
 Immediate Anesthesia Transfer of Care Note  Patient: Melissa Rich  Procedure(s) Performed: MASTECTOMY, SIMPLE (Right: Breast) BIOPSY, LYMPH NODE, SENTINEL, AXILLARY (Right: Axilla) INJECTION, FOR SENTINEL LYMPH NODE IDENTIFICATION (Breast)  Patient Location: PACU  Anesthesia Type:General  Level of Consciousness: awake  Airway & Oxygen Therapy: Patient Spontanous Breathing and Patient connected to face mask oxygen  Post-op Assessment: Report given to RN and Post -op Vital signs reviewed and stable  Post vital signs: Reviewed and stable  Last Vitals:  Vitals Value Taken Time  BP 144/59   Temp 97.5   Pulse 72   Resp 24   SpO2 100%     Last Pain:  Vitals:   01/14/24 0622  TempSrc: (P) Oral         Complications: No notable events documented.

## 2024-01-14 NOTE — Interval H&P Note (Signed)
 History and Physical Interval Note:  01/14/2024 7:26 AM  Melissa Rich  has presented today for surgery, with the diagnosis of DUCTAL CARCINOMA IN SITU, RIGHT BREAST.  The various methods of treatment have been discussed with the patient and family. After consideration of risks, benefits and other options for treatment, the patient has consented to  Procedure(s) with comments: MASTECTOMY, SIMPLE (Right) - pt knows to arrive at 6:15 BIOPSY, LYMPH NODE, SENTINEL, AXILLARY (Right) INJECTION, FOR SENTINEL LYMPH NODE IDENTIFICATION (N/A) as a surgical intervention.  The patient's history has been reviewed, patient examined, no change in status, stable for surgery.  I have reviewed the patient's chart and labs.  Questions were answered to the patient's satisfaction.     Lucretia Roers

## 2024-01-14 NOTE — Plan of Care (Signed)
  Problem: Education: Goal: Knowledge of General Education information will improve Description: Including pain rating scale, medication(s)/side effects and non-pharmacologic comfort measures Outcome: Progressing   Problem: Clinical Measurements: Goal: Ability to maintain clinical measurements within normal limits will improve Outcome: Progressing Goal: Will remain free from infection Outcome: Progressing Goal: Diagnostic test results will improve Outcome: Progressing   Problem: Activity: Goal: Risk for activity intolerance will decrease Outcome: Progressing   Problem: Nutrition: Goal: Adequate nutrition will be maintained Outcome: Adequate for Discharge

## 2024-01-14 NOTE — Anesthesia Postprocedure Evaluation (Signed)
 Anesthesia Post Note  Patient: Melissa Rich  Procedure(s) Performed: MASTECTOMY, SIMPLE (Right: Breast) BIOPSY, LYMPH NODE, SENTINEL, AXILLARY (Right: Axilla) INJECTION, FOR SENTINEL LYMPH NODE IDENTIFICATION (Right: Breast)  Patient location during evaluation: PACU Anesthesia Type: General Level of consciousness: awake and alert Pain management: pain level controlled Vital Signs Assessment: post-procedure vital signs reviewed and stable Respiratory status: spontaneous breathing, nonlabored ventilation, respiratory function stable and patient connected to nasal cannula oxygen Cardiovascular status: blood pressure returned to baseline and stable Postop Assessment: no apparent nausea or vomiting Anesthetic complications: no   There were no known notable events for this encounter.   Last Vitals:  Vitals:   01/14/24 1045 01/14/24 1103  BP:  117/63  Pulse: 68 65  Resp: 12 12  Temp:  36.4 C  SpO2: 96%     Last Pain:  Vitals:   01/14/24 1103  TempSrc: Oral  PainSc:                  Gaetano Hawthorne

## 2024-01-14 NOTE — Anesthesia Procedure Notes (Signed)
 Procedure Name: Intubation Date/Time: 01/14/2024 7:39 AM  Performed by: Julian Reil, CRNAPre-anesthesia Checklist: Patient identified, Emergency Drugs available, Suction available and Patient being monitored Patient Re-evaluated:Patient Re-evaluated prior to induction Oxygen Delivery Method: Circle system utilized Preoxygenation: Pre-oxygenation with 100% oxygen Induction Type: IV induction Ventilation: Mask ventilation without difficulty Laryngoscope Size: Miller and 3 Grade View: Grade I Tube type: Oral Tube size: 7.0 mm Number of attempts: 1 Airway Equipment and Method: Stylet Placement Confirmation: ETT inserted through vocal cords under direct vision, positive ETCO2 and breath sounds checked- equal and bilateral Secured at: 23 cm Tube secured with: Tape Dental Injury: Teeth and Oropharynx as per pre-operative assessment

## 2024-01-14 NOTE — Progress Notes (Signed)
 Patient stable and will be transported to room 306.  Report called to T. [place], Charity fundraiser.

## 2024-01-14 NOTE — Op Note (Addendum)
 [facility] Operative Note  01/14/24  Preoperative Diagnosis: High grade ductal carcinoma in situ with necrosis, right breast    Postoperative Diagnosis: Same   Procedure(s) Performed: Right breast total mastectomy and sentinel lymph node biopsy after Magtrace    Surgeon: Leatrice Jewels. 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 She had two areas that spanned 4.8 cm in the breast. We discussed her options and I discussed the case with Oncology given the high grade and triple negative and opted for sentinel node biopsy in addition to a total mastectomy. We discussed risk of bleeding, infection, needing more surgery, needing more treatment, lymphedema.   Findings: Normal anatomy    Procedure: The patient was taken to the operating room and placed supine. General endotracheal anesthesia was induced. Intravenous antibiotics were administered per protocol. Magtrace was injected subareola in the standard fashion and the breast was massaged for 5 minutes. The right breast, chest and axilla were prepped and draped in the usual sterile fashion.   An elliptical incision was made encompassing the nipple areola complex. Flaps were raised in the avascular plane between the subcutaneous tissue and the breast tissue from the clavicle, to the sternum, to the rectus sheath and to the lateral border of the pectoralis. Hemostasis of the flaps were achieved. Next the breast tissue and underlying pectoralis fascia were excised from the pectoralis major muscle progressing medial to lateral. At the  lateral border of the pectoralis the breast was swung laterally and where the breast gave ay to fat of the axilla  the lateral pedicle was incised and removed. The breast was marked suture superior.  The Magtrace probe had been used to verify the Magtrace uptake prior to completely excising the breast and on the lateral aspect to ensure no intramammary nodes were identified.  The Magtrace probe was then placed in the axilla and in vivo counts of 3260 were noted. A large brownish node was excised with ex vivo counts of 1860.  This was excised using scissors and cautery and clips were used on the lymphatic ducts and vessels that were noted. All additional nodes were excised in the same fashion. A second node was found that had in vivo counts of 663 and ex vivo counts of 83. I felt like this had not been the true second node given the ex vivo count, and I was able to identify a third node with in vivo counts of 1140 and ex vivo counts of 1027. These last two nodes were not brown in color but were hot with the Magtrace. The background count was still 822 but I could not feel any more obvious lymph nodes and I was getting deep. Given the diagnosis and the patient's age, I opted to not go after any more sentinel nodes given the risk of complications like lymphedema becoming higher with deeper nodes being taken.  Hemostasis was achieved. A JP drain was placed through a stab incision in the right flank. The mastectomy site was closed with 2-0 Vicryl interrupted sutures in the flap. The skin skin was  closed with staples. The JP was secured with a 3-0 Nylon. Marcaine was injected. A gauze and ABD and breast binder were placed.    Final inspection revealed acceptable hemostasis. All counts were correct at the end of the case. The patient was awakened from anesthesia and extubated without complication.  The patient went to the PACU in stable condition.   Melissa Greenhouse, MD [facility] [address] [place], [place] [address] [phone] (office)

## 2024-01-15 ENCOUNTER — Encounter (HOSPITAL_COMMUNITY): Payer: Self-pay | Admitting: General Surgery

## 2024-01-15 DIAGNOSIS — D0511 Intraductal carcinoma in situ of right breast: Secondary | ICD-10-CM | POA: Diagnosis not present

## 2024-01-15 LAB — CBC
HCT: 33.1 % — ABNORMAL LOW (ref 36.0–46.0)
Hemoglobin: 10.6 g/dL — ABNORMAL LOW (ref 12.0–15.0)
MCH: 29.6 pg (ref 26.0–34.0)
MCHC: 32 g/dL (ref 30.0–36.0)
MCV: 92.5 fL (ref 80.0–100.0)
Platelets: 260 10*3/uL (ref 150–400)
RBC: 3.58 MIL/uL — ABNORMAL LOW (ref 3.87–5.11)
RDW: 12.6 % (ref 11.5–15.5)
WBC: 10.4 10*3/uL (ref 4.0–10.5)
nRBC: 0 % (ref 0.0–0.2)

## 2024-01-15 LAB — BASIC METABOLIC PANEL WITH GFR
Anion gap: 8 (ref 5–15)
BUN: 14 mg/dL (ref 8–23)
CO2: 25 mmol/L (ref 22–32)
Calcium: 7.8 mg/dL — ABNORMAL LOW (ref 8.9–10.3)
Chloride: 98 mmol/L (ref 98–111)
Creatinine, Ser: 0.83 mg/dL (ref 0.44–1.00)
GFR, Estimated: >60 mL/min (ref >60)
Glucose, Bld: 140 mg/dL — ABNORMAL HIGH (ref 70–99)
Potassium: 3.4 mmol/L — ABNORMAL LOW (ref 3.5–5.1)
Sodium: 131 mmol/L — ABNORMAL LOW (ref 135–145)

## 2024-01-15 MED ORDER — OXYCODONE HCL 5 MG PO TABS: 5.0000 mg | ORAL_TABLET | ORAL | 0 refills | Status: DC | PRN

## 2024-01-15 MED ORDER — ONDANSETRON 4 MG PO TBDP: 4.0000 mg | ORAL_TABLET | Freq: Four times a day (QID) | ORAL | 0 refills | Status: DC | PRN

## 2024-01-15 NOTE — Discharge Instructions (Signed)
 Discharge instructions after breast surgery:   Common Complaints: Pain and bruising at the incision sites.  Swelling at the incision sites. Stiffness of the arm.   JP Drain Care:  Please keep the drain clean and dry. Replace the gauze/ tape around the drain if it gets dirty or wet/ saturated. Please do not mess with or cut the stitch that is keeping the drain in place. Secure the drain to your clothes so that it does not get dislodged.  You may want to wear a binder around your abdomen (girdle) at night for sleeping if you are worried about it getting pulled out.  Please record the output from the drain daily including the color and the amount in milliliters.  Please keep the drain covered with plastic and tape when you shower so that it does not get wet.   Diet/ Activity: Diet as tolerated.  You may shower but do not take hot showers as this can disrupt the glue. Rest and listen to your body, but do not remain in bed all day.  Walk everyday for at least 15-20 minutes. Deep cough and move around every 1-2 hours in the first few days after surgery.  Do not lift > 10 lbs for the first 2 weeks after surgery. Do not do anything that makes you feel like you are putting unnecessary pull or stretch on the incision sites.  Do move your arm and shoulder (see exercises options below). If you do not move then you can get stiff and hurt more.  Do not place lotions or balms on your incision unless instructed to specifically by Dr. Henreitta Leber.  Take a Birdbath/ wipe off for now. Do not get the drain site or the incision site wet.   Pain Expectations and Narcotics: -After surgery you will have pain associated with your incisions and this is normal. The pain is muscular and nerve pain, and will get better with time. -You are encouraged and expected to take non narcotic medications like tylenol and ibuprofen (when able) to treat pain as multiple modalities can aid with pain treatment. -Narcotics are only  used when pain is severe or there is breakthrough pain. -You are not expected to have a pain score of 0 after surgery, as we cannot prevent pain. A pain score of 3-4 that allows you to be functional, move, walk, and tolerate some activity is the goal. The pain will continue to improve over the days after surgery and is dependent on your surgery. -Due to [place] law, we are only able to give a certain amount of pain medication to treat post operative pain, and we only give additional narcotics on a patient by patient basis.  -For most laparoscopic surgery, studies have shown that the majority of patients only need 10-15 narcotic pills, and for open surgeries most patients only need 15-20.   -Having appropriate expectations of pain and knowledge of pain management with non narcotics is important as we do not want anyone to become addicted to narcotic pain medication.  -Using ice packs in the first 48 hours and heating pads after 48 hours, wearing an abdominal binder (when recommended), and using over the counter medications are all ways to help with pain management.   -Simple acts like meditation and mindfulness practices after surgery can also help with pain control and research has proven the benefit of these practices.  Medication: Take tylenol and ibuprofen as needed for pain control, alternating every 4-6 hours.  Example:  Tylenol 1000mg  @ 6am,  12noon, 6pm, (Do not exceed 4000mg  of tylenol a day). Ibuprofen 800mg  @ 9am, 3pm, 9pm, 3am (Do not exceed 3600mg  of ibuprofen a day).  Take Roxicodone for breakthrough pain every 4 hours.  Take Colace for constipation related to narcotic pain medication. If you do not have a bowel movement in 2 days, take Miralax over the counter.  Drink plenty of water to also prevent constipation.   Contact Information: If you have questions or concerns, please call our office, [phone], Monday- Thursday 8AM-5PM and Friday 8AM-12Noon.  If it is after hours or  on the weekend, please call [facility], [phone], and ask to speak to the surgeon on call for Dr. Henreitta Leber at [facility].   Exercises After Breast Surgery Do at least a few of the exercises below twice a day. It is ok to start the day after surgery and gradually build up the amount and type of exercises you do. Link to the exercises with pictures ([email or web address].[email or web address]).   Deep Breathing Exercise Deep breathing can help you relax and ease discomfort and tightness around your incision (surgical cut). It's also a very good way to relieve stress during the day.  Sit comfortably in a chair. Take a slow, deep breath through your nose. Let your chest and belly expand. Breathe out slowly through your mouth. Repeat as many times as needed.  Arm and Shoulder Exercises Doing arm and shoulder exercises will help you get back your full range of motion on your affected side (the side where you had your surgery). With full range of motion, you'll be able to: Move your arm over your head and out to the side Move your arm behind your neck Move your arm to the middle of your back Do each of the exercises below 5 times a day. Keep doing this until you have a full range of motion again and can use your arm as you did before surgery in all your normal activities. This includes activities at work, at home, and in recreation or sports. If you had limited movement in your arm before surgery, your goal will be to get back as much movement as you had before.  If you get your full range of motion back quickly, keep doing these exercises once a day instead of 5 times a day. This is especially true if you feel any tightness in your chest, shoulder, or under your affected arm. These exercises can help keep scar tissue from forming in your armpit and shoulder. Scar tissue can limit your arm movements later.  If you still have trouble moving your  shoulder 4 weeks after your surgery, tell your surgeon. They'll tell you if you need more rehabilitation, such as physical or occupational therapy.  If you had one of the following surgeries, you can do the following set of exercises on the first day after your surgery, as long as your surgeon tells you it's safe.  Shoulder rolls The shoulder roll is a good exercise to start with because it gently stretches your chest and shoulder muscles.  Stand or sit comfortably with your arms relaxed at your sides. Start with backward shoulder rolls. In a circular motion, bring your shoulders forward, up, backward, and down. Do this 10 times. Switch directions and do 10 forward shoulder rolls. Bring your shoulders backward, up, forward, and down. Do this 10 times. Try to make the circles as big as you can and move both shoulders at the same time. If you have some tightness  across your incision or chest, start with smaller circles and make them bigger as the tightness decreases. The backward direction might feel a little tighter across your chest than the forward direction. This will get better with practice.  Shoulder wings The shoulder wings exercise will help you get back outward movement of your shoulder. You can do this exercise while sitting or standing.  Place your hands on your chest or collarbone. Raise your elbows out to the side, limiting your range of motion as instructed by your healthcare team. Slowly lower your elbows. Do this 10 times. Then, slowly lower your hands. If you feel discomfort while doing this exercise, hold your position and do the deep breathing exercise. If the discomfort passes, raise your elbows a little higher. If it doesn't pass, don't raise your elbows any higher. Finish the exercise raising your elbows only high enough to feel a gentle stretch and no discomfort.  Arm circles If you had surgery on both breasts, do this exercise with both arms, 1 arm at a time. Don't do  this exercise with both arms at the same time. This will put too much pressure on your chest.  Stand with your feet slightly apart for balance. Raise your affected arm out to the side as high as you can, limiting your range of movement as instructed by your healthcare team. Start making slow, backward circles in the air with your arm. Make sure you're moving your arm from your shoulder, not your elbow. Keep your elbow straight. Increase the size of the circles until they're as big as you can comfortably make them, limiting your range of motion as instructed by your healthcare team. If you feel any aching or if your arm is tired, take a break. Keep doing the exercise when you feel better. Do 10 full backward circles. Then, slowly lower your arm to your side. Rest your arm for a moment. Follow steps 1 to 4 again, but this time make slow, forward circles.  W exercise You can do the W exercise while sitting or standing.  Form a "W" with your arms out to the side and palms facing forward (see Figure 4). Try to bring your hands up so they're even with your face. If you can't raise your arms that high, bring them to the highest comfortable position. Make sure to limit your range of motion as instructed by your healthcare team. Pinch your shoulder blades together and downward, as if you're squeezing a pencil between them. If you feel discomfort, stop at that position and do the deep breathing exercise. If the discomfort passes, try to bring your arms back a little further. If it doesn't pass, don't reach any further. Hold the furthest position that doesn't cause discomfort. Squeeze your shoulder blades together and downward for 5 seconds. Slowly bring your arms back down to the starting position. Repeat this movement 10 times.  Back Climb You can do the back climb stretch while sitting or standing. You'll need a timer or stopwatch.  Place your hands behind your back. Hold the hand on your affected side  with your other hand. If you had surgery on both breasts, use the arm that moves most easily to hold the other. Slowly slide your hands up the center of your back as far as you can. If you feel tightness near your incision, stop at that position and do the deep breathing exercise. If the tightness decreases, try to slide your hands up a little further. If it  doesn't decrease, don't slide your hands up any further. Hold the highest position you can for 1 minute. Use your stopwatch or timer to keep track. You should feel a gentle stretch in your shoulder area. After 1 minute, slowly lower your hands.  Hands behind neck You can do the hands behind neck stretch while sitting or standing. You'll need a timer or stopwatch.  Clasp your hands together on your lap or in front of you. Slowly raise your hands toward your head, keeping your elbows together in front of you, not out to the sides. Keep your head level. Don't bend your neck or head forward. Slide your hands over your head until you reach the back of your neck. When you get to this point, spread your elbows out to the sides. Hold this position for 1 minute. Use your stopwatch or timer to keep track. Breathe normally. Don't hold your breath as you stretch your body. If you have some tightness across your incision or chest, hold your position and do the deep breathing exercise. If the tightness decreases, continue with the movement. If the tightness stays the same, reach up and stretch your elbows back as best as you can without causing discomfort. Hold the position you're most comfortable in for 1 minute. Slowly come out of the stretch by bringing your elbows together and sliding your hands over your head. Then, slowly lower your arms.  Forward wall crawls You'll need 2 pieces of tape for the forward wall crawl exercise.  Stand facing a wall. Your toes should be about 6 inches (15 centimeters) from the wall. Reach as high as you can with your  unaffected arm. Loraine Leriche that point with a piece of tape. This will be the goal for your affected arm. If you had surgery on both breasts, set your goal using the arm that moves most comfortably. Place both hands against the wall at a level that's comfortable. Crawl your fingers up the wall as far as you can, keeping them even with each other.. Try not to look up toward your hands or arch your back. When you get to the point where you feel a good stretch, but not pain, do the deep breathing exercise. Return to the starting position by crawling your fingers back down the wall. Repeat the wall crawl 10 times. Each time you raise your hands, try to crawl a little bit higher. On the 10th crawl, use the other piece of tape to mark the highest point you reached with your affected arm. This will let you to see your progress each time you do this exercise. As you become more flexible, you may need to take a step closer to the wall so you can reach a little higher.   Side wall crawls You'll also need 2 pieces of tape for the side wall crawl exercise.  You shouldn't feel pain while doing this exercise. It's normal to feel some tightness or pulling across the side of your chest. Focus on your breathing until the tightness decreases. Breathe normally throughout this exercise. Don't hold your breath.  Be careful not to turn your body toward the wall while doing this exercise. Make sure only the side of your body faces the wall.  If you had surgery on both breasts, start with step 3.  Stand with your unaffected side closest to the wall, about 1 foot (30.5 centimeters) away from the wall. Reach as high as you can with your unaffected arm. Loraine Leriche that point with a  piece of tape (see Figure 8). This will be the goal for your affected arm. Turn your body so your affected side is now closest to the wall. If you had surgery on both breasts, start with either side closest to the wall. Crawl your fingers up the wall as far  as you can. When you get to the point where you feel a good stretch, but not pain, do the deep breathing exercise. Return to the starting position by crawling your fingers back down the wall. Repeat this exercise 10 times. On your 10th crawl, use a piece of tape to mark the highest point you reached with your affected arm. This will let you see your progress each time you do the exercise. If you had surgery on both breasts, repeat the exercise with your other arm.  Swelling After your surgery, you may have some swelling or puffiness in your hand or arm on your affected side. This is normal and usually goes away on its own.  If you notice swelling in your hand or arm, follow the tips below to help the swelling go away.  Raise your arm above your head and do hand pumps several times a day. To do hand pumps, slowly open and close your fist 10 times. This will help drain the fluid out of your arm. Don't hold your arm straight up over your head for more than a few minutes. This can cause your arm muscles to get tired. Raise your arm to the side a few times a day for about 20 minutes at a time. To do this, sit or lie down on your back. Rest your arm on a few pillows next to you so it's raised above the level of your heart. If you're able to sleep on your unaffected side, you can place 1 or 2 pillows in front of you and rest your affected arm on them while you sleep. If the swelling doesn't go down within 4 to 6 weeks, call your surgeon or nurse.

## 2024-01-15 NOTE — Plan of Care (Signed)

## 2024-01-15 NOTE — Care Management Obs Status (Signed)
 MEDICARE OBSERVATION STATUS NOTIFICATION   Patient Details  Name: Melissa Rich MRN: [id number] Date of Birth: [birth year]   Medicare Observation Status Notification Given:  Yes    Corey Harold 01/15/2024, 11:46 AM

## 2024-01-15 NOTE — Progress Notes (Signed)
 [facility]  Doing ok. Having some pain. Has not worked with therapy. Learning her drain care. She is a little nervous about going home.  20cc recorded JP today (40cc yesterday)   BP 124/71 (BP Location: Left Arm)   Pulse 71   Temp 97.7 F (36.5 C) (Oral)   Resp 20   Ht 5' 2.99" (1.6 m)   Wt 78 kg   SpO2 99%   BMI 30.47 kg/m  Swelling noted, staples c/d/I with no erythema or drainage JP with sanguinous output   Patient s/p right total mastectomy and sentinel lymph node biopsy. Doing fair.   PRN For pain Encouraged her to use roxicodone if needed PT needs to work with her Continue to learn JP drain care H&H drifted from blood loss and dilution from fluids Possibly home later today versus tomorrow pending how she does with therapy and education   Algis Greenhouse, MD [facility] [address] [place], [place] [address] [phone] (office)

## 2024-01-15 NOTE — Evaluation (Signed)
 Physical Therapy Evaluation Patient Details Name: Melissa Rich MRN: [id number] DOB: 12/02/1950 Today's Date: 01/15/2024  History of Present Illness  Right breast total mastectomy and sentinel lymph node biopsy after Magtrace on 01/14/24  Clinical Impression  PT given s/p mastectomy information sheet as well as Shoulder ROM.  Pt has no mobility issues at this time.         If plan is discharge home, recommend the following:  none   Can travel by private vehicle    yes    Equipment Recommendations  none  Recommendations for Other Services    none   Functional Status Assessment  Pt I with mobility     Precautions / Restrictions Precautions Precautions: Other (comment) Precaution/Restrictions Comments: no lifting with UE Restrictions Weight Bearing Restrictions Per Provider Order: No      Mobility  Bed Mobility Overal bed mobility: Modified Independent                  Transfers Overall transfer level: Modified independent Equipment used: None                    Ambulation/Gait Ambulation/Gait assistance: Modified independent (Device/Increase time) Gait Distance (Feet): 180 Feet Assistive device: None Gait Pattern/deviations: Step-through pattern   Gait velocity interpretation: 1.31 - 2.62 ft/sec, indicative of limited community ambulator            Pertinent Vitals/Pain Pain Assessment Pain Assessment: No/denies pain    Home Living  Lives with husband does not use AD                        Prior Function  I                     Extremity/Trunk Assessment        Lower Extremity Assessment Lower Extremity Assessment: Overall WFL for tasks assessed       Communication   Communication Communication: No apparent difficulties    Cognition Arousal: Alert     PT - Cognitive impairments: No apparent impairments                                 Cueing Cueing Techniques: Verbal cues     General  Comments      Exercises Shoulder Exercises Shoulder Flexion: AAROM, Right, 5 reps Shoulder ABduction: AAROM, 5 reps Shoulder External Rotation: 5 reps, Both   Assessment/Plan    PT Assessment Patient does not need any further PT services  PT Problem List  Decreased ROM       PT Treatment Interventions   Education on exercises   PT Goals (Current goals can be found in the Care Plan section)  Acute Rehab PT Goals Patient Stated Goal: to go home PT Goal Formulation: With patient Time For Goal Achievement: 01/15/24 Potential to Achieve Goals: Good    Frequency  N/A        AM-PAC PT "6 Clicks" Mobility  Outcome Measure Help needed turning from your back to your side while in a flat bed without using bedrails?: None Help needed moving from lying on your back to sitting on the side of a flat bed without using bedrails?: None Help needed moving to and from a bed to a chair (including a wheelchair)?: None Help needed standing up from a chair using your arms (e.g., wheelchair or bedside chair)?: None Help needed  to walk in hospital room?: None Help needed climbing 3-5 steps with a railing? : None 6 Click Score: 24    End of Session Equipment Utilized During Treatment: Gait belt Activity Tolerance: Patient tolerated treatment well Patient left: in bed Nurse Communication: Mobility status PT Visit Diagnosis: Unsteadiness on feet (R26.81)    Time: 1315-1330 PT Time Calculation (min) (ACUTE ONLY): 15 min   Charges:   PT Evaluation $PT Eval Moderate Complexity: 1 Mod   PT General Charges $$ ACUTE PT VISIT: 1 Visit        Virgina Organ, PT CLT [phone]  01/15/2024, 1:35 PM

## 2024-01-15 NOTE — Plan of Care (Signed)

## 2024-01-15 NOTE — TOC CM/SW Note (Signed)
 Transition of Care [facility]) - Inpatient Brief Assessment   Patient Details  Name: Melissa Rich MRN: [id number] Date of Birth: Mar 24, 1951  Transition of Care [facility]) CM/SW Contact:    Isabella Bowens, LCSWA Phone Number: 01/15/2024, 8:17 AM   Clinical Narrative:  Transition of Care Department [facility]) has reviewed patient and no TOC needs have been identified at this time. We will continue to monitor patient advancement through interdisciplinary progression rounds. If new patient transition needs arise, please place a TOC consult.   Transition of Care Asessment: Insurance and Status: Insurance coverage has been reviewed Patient has primary care physician: Yes Home environment has been reviewed: Single Family Home Prior level of function:: Independent Prior/Current Home Services: No current home services Social Drivers of Health Review: SDOH reviewed no interventions necessary Readmission risk has been reviewed: Yes Transition of care needs: no transition of care needs at this time

## 2024-01-16 ENCOUNTER — Other Ambulatory Visit: Payer: Self-pay | Admitting: *Deleted

## 2024-01-16 DIAGNOSIS — D0511 Intraductal carcinoma in situ of right breast: Secondary | ICD-10-CM | POA: Diagnosis not present

## 2024-01-16 DIAGNOSIS — D62 Acute posthemorrhagic anemia: Secondary | ICD-10-CM | POA: Insufficient documentation

## 2024-01-16 DIAGNOSIS — E871 Hypo-osmolality and hyponatremia: Secondary | ICD-10-CM | POA: Insufficient documentation

## 2024-01-16 LAB — CBC
HCT: 35.9 % — ABNORMAL LOW (ref 36.0–46.0)
Hemoglobin: 11.5 g/dL — ABNORMAL LOW (ref 12.0–15.0)
MCH: 29.9 pg (ref 26.0–34.0)
MCHC: 32 g/dL (ref 30.0–36.0)
MCV: 93.5 fL (ref 80.0–100.0)
Platelets: 285 10*3/uL (ref 150–400)
RBC: 3.84 MIL/uL — ABNORMAL LOW (ref 3.87–5.11)
RDW: 12.9 % (ref 11.5–15.5)
WBC: 7.3 10*3/uL (ref 4.0–10.5)
nRBC: 0 % (ref 0.0–0.2)

## 2024-01-16 LAB — BASIC METABOLIC PANEL WITH GFR
Anion gap: 7 (ref 5–15)
BUN: 19 mg/dL (ref 8–23)
CO2: 26 mmol/L (ref 22–32)
Calcium: 8.3 mg/dL — ABNORMAL LOW (ref 8.9–10.3)
Chloride: 99 mmol/L (ref 98–111)
Creatinine, Ser: 0.94 mg/dL (ref 0.44–1.00)
GFR, Estimated: >60 mL/min (ref >60)
Glucose, Bld: 94 mg/dL (ref 70–99)
Potassium: 3.7 mmol/L (ref 3.5–5.1)
Sodium: 132 mmol/L — ABNORMAL LOW (ref 135–145)

## 2024-01-16 LAB — SURGICAL PATHOLOGY

## 2024-01-16 NOTE — Plan of Care (Signed)

## 2024-01-16 NOTE — Discharge Summary (Signed)
 Physician Discharge Summary  Patient ID: Melissa Rich MRN: [id number] DOB/AGE: [birth year] 73 y.o.  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 Over the last two days she has learned how to care for her drain, and has been moving around and worked with therapy on exercises for after mastectomy. She was eating and having pain control.   Consults:  physical therapy  Significant Diagnostic Studies:   Latest Reference Range & Units 01/16/24 04:18  Sodium 135 - 145 mmol/L 132 (L)  Potassium 3.5 - 5.1 mmol/L 3.7  Chloride 98 - 111 mmol/L 99  CO2 22 - 32 mmol/L 26  Glucose 70 - 99 mg/dL 94  BUN 8 - 23 mg/dL 19  Creatinine 0.25 - 4.27 mg/dL 0.62  Calcium 8.9 - 37.6 mg/dL 8.3 (L)  Anion gap 5 - 15  7  GFR, Estimated >60 mL/min >60  WBC 4.0 - 10.5 K/uL 7.3  RBC 3.87 - 5.11 MIL/uL 3.84 (L)  Hemoglobin 12.0 - 15.0 g/dL 28.3 (L)  HCT 15.1 - 76.1 % 35.9 (L)  MCV 80.0 - 100.0 fL 93.5  MCH 26.0 - 34.0 pg 29.9  MCHC 30.0 - 36.0 g/dL 60.7  RDW 37.1 - 06.2 % 12.9  Platelets 150 - 400 K/uL 285  nRBC 0.0 - 0.2 % 0.0  (L): Data is abnormally low H&H stabilized  Treatments: IV hydration and Right total mastectomy and SLNB after magtrace 01/14/2024   Discharge Exam: Blood pressure (!) 102/52, pulse 69, temperature 98.5 F (36.9 C), temperature source Oral, resp. rate 18, height 5' 2.99" (1.6 m),  weight 78 kg, SpO2 97%. General appearance: alert and no distress Resp: normal work of breathing Incision/Wound: Right mastectomy site c/di with minor bruising, minor drainage, no erythema, tender appropriately, some swelling, JP with SS output, stripped tubing   Disposition: Discharge disposition: 01-Home or Self Care       Discharge Instructions     Call MD for:  difficulty breathing, headache or visual disturbances   Complete by: As directed    Call MD for:  extreme fatigue   Complete by: As directed    Call MD for:  persistant dizziness or light-headedness   Complete by: As directed    Call MD for:  persistant nausea and vomiting   Complete by: As directed    Call MD for:  redness, tenderness, or signs of infection (pain, swelling, redness, odor or green/yellow discharge around incision site)   Complete by: As directed    Call MD for:  severe uncontrolled pain   Complete by: As directed    Call MD for:  temperature >100.4   Complete by: As directed    Increase activity slowly   Complete by: As directed       Allergies as of 01/16/2024       Reactions   Codeine Swelling   Ankle Area        Medication List  TAKE these medications    alendronate 70 MG tablet Commonly known as: FOSAMAX Take 70 mg by mouth once a week. Take with a full glass of water on an empty stomach.   atorvastatin 10 MG tablet Commonly known as: LIPITOR Take 10 mg by mouth daily.   CALCIUM PO Take 1 tablet by mouth daily.   cholecalciferol 25 MCG (1000 UNIT) tablet Commonly known as: VITAMIN D3 Take 1,000 Units by mouth daily.   Fish Oil 1000 MG Caps Take 1,000 mg by mouth daily.   hydrochlorothiazide 12.5 MG tablet Commonly known as: HYDRODIURIL Take 12.5 mg by mouth daily. At night   loratadine 10 MG tablet Commonly known as: CLARITIN Take 10 mg by mouth daily as needed for allergies.   losartan 50 MG tablet Commonly known as: COZAAR Take 50 mg by mouth daily. At  night   ondansetron 4 MG disintegrating tablet Commonly known as: ZOFRAN-ODT Take 1 tablet (4 mg total) by mouth every 6 (six) hours as needed for nausea.   oxyCODONE 5 MG immediate release tablet Commonly known as: Oxy IR/ROXICODONE Take 1 tablet (5 mg total) by mouth every 4 (four) hours as needed for severe pain (pain score 7-10) or breakthrough pain.   vitamin C 250 MG tablet Commonly known as: ASCORBIC ACID Take 250 mg by mouth daily.        Follow-up Information     Lucretia Roers, MD Follow up.   Specialty: General Surgery Contact information: [address] [place] [facility] [address] [phone]                Future Appointments  Date Time Provider Department Center  01/21/2024  3:15 PM Lucretia Roers, MD [facility] None    Signed: Lucretia Roers 01/16/2024, 9:18 AM

## 2024-01-16 NOTE — Progress Notes (Signed)
 Left a VM on home phone-Notified patient pathology with the DCIS, no invasive cancer, all margins clear and lymph nodes negative.

## 2024-01-16 NOTE — Progress Notes (Signed)
 [facility] Progress Note  2 Days Post-Op  Subjective: Melissa Rich is a 73 y.o. female on post-op day 2 following a R total mastectomy. This morning, she reports no pain at rest, and only minor pain at the surgical site with upper body exertion. She noted some mild swelling at the site of her IV, but has not been receiving fluids. She has been tolerating her bowel well, and has been passing gas but has not had a BM since Wednesday morning. She denies any fever, chills, nausea, vomiting.  Objective: Vital signs in last 24 hours: Temp:  [98.1 F (36.7 C)-98.5 F (36.9 C)] 98.5 F (36.9 C) (04/04 0339) Pulse Rate:  [67-78] 69 (04/04 0339) Resp:  [18-20] 18 (04/04 0339) BP: (102-134)/(52-71) 102/52 (04/04 0339) SpO2:  [97 %-99 %] 97 % (04/04 0339) Last BM Date : 01/14/24  Intake/Output from previous day: 04/03 0701 - 04/04 0700 In: 944 [P.O.:944] Out: 63 [Urine:3; Drains:60] Intake/Output this shift: No intake/output data recorded.  General appearance: alert and no distress Patient stated she would prefer for physical examination alongside physician.  Lab Results:  Recent Labs    01/15/24 0416 01/16/24 0418  WBC 10.4 7.3  HGB 10.6* 11.5*  HCT 33.1* 35.9*  PLT 260 285   BMET Recent Labs    01/15/24 0416 01/16/24 0418  NA 131* 132*  K 3.4* 3.7  CL 98 99  CO2 25 26  GLUCOSE 140* 94  BUN 14 19  CREATININE 0.83 0.94  CALCIUM 7.8* 8.3*   PT/INR No results for input(s): "LABPROT", "INR" in the last 72 hours.  Studies/Results: No results found.  Anti-infectives: Anti-infectives (From admission, onward)    Start     Dose/Rate Route Frequency Ordered Stop   01/14/24 0630  ceFAZolin (ANCEF) IVPB 2g/100 mL premix        2 g 200 mL/hr over 30 Minutes Intravenous On call to O.R. 01/14/24 0622 01/14/24 0755       Assessment/Plan: s/p Procedure(s): MASTECTOMY, SIMPLE BIOPSY, LYMPH NODE, SENTINEL, AXILLARY INJECTION, FOR SENTINEL LYMPH NODE  IDENTIFICATION Patient appears to be recovering well from procedure. She is able to ambulate and pain is well controlled. Consider discharge.  LOS: 0 days    Fleet Contras 01/16/2024  Note: Portions of this report may have been transcribed using voice recognition software. Every effort has been made to ensure accuracy; however, inadvertent computerized transcription errors may still be present.

## 2024-01-21 ENCOUNTER — Encounter: Payer: Self-pay | Admitting: General Surgery

## 2024-01-21 ENCOUNTER — Ambulatory Visit (INDEPENDENT_AMBULATORY_CARE_PROVIDER_SITE_OTHER): Admitting: General Surgery

## 2024-01-21 VITALS — BP 128/77 | HR 80 | Temp 98.5°F | Resp 14 | Ht 63.0 in | Wt 172.0 lb

## 2024-01-21 DIAGNOSIS — D0511 Intraductal carcinoma in situ of right breast: Secondary | ICD-10-CM

## 2024-01-21 NOTE — Patient Instructions (Addendum)
 Remove bandage in 48 hours. Ok to replace if needed. Do exercises. Will remove the rest of the staples in 1 week. Trula Ore will remove the staples. Come to the office on Thursday 01/29/24 @ 1pm and Trula Ore will see you to take out the staples (anytime after the appt with Oncology). I will see you the next week.

## 2024-01-21 NOTE — Progress Notes (Unsigned)
 [facility]  Doing well. Serous fluid in the drain. Has been putting out 50cc or less a day.  BP 128/77   Pulse 80   Temp 98.5 F (36.9 C) (Oral)   Resp 14   Ht 5\' 3"  (1.6 m)   Wt 172 lb (78 kg)   SpO2 98%   BMI 30.47 kg/m  Serous JP drain fluid Staples c/d/I with no eythema or drainage Half staples removed, JP removed, bandage placed   Patient s/p right total mastectomy and Sentinel lymph node biopsy doing well.   Remove bandage in 48 hours. Ok to replace if needed. Do exercises. Will remove the rest of the staples in 1 week. Trula Ore will remove the staples. Come to the office on Thursday 01/29/24 @ 1pm and Trula Ore will see you to take out the staples (anytime after the appt with Oncology). I will see you the next week.   Future Appointments  Date Time Provider Department Center  01/29/2024 10:00 AM Cindie Crumbly, MD [facility] None  01/29/2024 11:15 AM [facility] None  02/05/2024  2:30 PM Lucretia Roers, MD [facility] None    Algis Greenhouse, MD [facility] [address] [place], [place] [address] [phone] (office)

## 2024-01-28 NOTE — Progress Notes (Unsigned)
 Hematology-Oncology Clinic Note  Nsumanganyi, Colleen Can, NP   Reason for Referral: DCIS  Oncology History: I have reviewed her chart and materials related to her cancer extensively and collaborated history with the patient. Summary of oncologic history is as follows:  Oncology History  Ductal carcinoma in situ (DCIS) of right breast  12/02/2023 Mammogram   IMPRESSION: Indeterminate linear oriented 5.2 cm group of calcifications in the upper slightly inner right breast.   12/10/2023 Pathology Results   FINAL DIAGNOSIS   1. Breast, right, needle core biopsy, upper inner quadrant, posterior (x clip): DUCTAL CARCINOMA IN SITU, HIGH NUCLEAR GRADE  NECROSIS: PRESENT  CALCIFICATIONS: PRESENT  DCIS LENGTH: 0.4 CM       2. Breast, right, needle core biopsy, upper inner quadrant, anterior (ribbon clip) :  DUCTAL CARCINOMA IN SITU, HIGH NUCLEAR GRADE  NECROSIS: PRESENT  CALCIFICATIONS: PRESENT  DCIS LENGTH: 0.2 CM     12/25/2023 Initial Diagnosis   Ductal carcinoma in situ (DCIS) of right breast   01/14/2024 Pathology Results   FINAL MICROSCOPIC DIAGNOSIS:  A. BREAST, RIGHT, MASTECTOMY: Ductal carcinoma in situ, solid type with comedonecrosis, nuclear grade 3 of 3 DCIS greatest dimension: 4.6 x 3.9 x 3.2 cm Margins: negative Closest margin: 0.8 cm anterior-superior Prognostic markers: ER negative, PR negative Biopsy site clips Other findings: N/A See oncology table  B. SENTINEL LYMPH NODE, RIGHT AXILLARY, EXCISION: -  One benign lymph node (0/1).  C. SENTINEL LYMPH NODE, RIGHT AXILLARY, EXCISION: -  One benign lymph node (0/1).  D. SENTINEL LYMPH NODE, RIGHT AXILLARY, EXCISION: -  One benign lymph node (0/1).  ONCOLOGY TABLE: Histologic Type: Ductal carcinoma in situ Size of DCIS: 4.6 x 3.9 x 3.2 cm Nuclear Grade: 3/3 Necrosis: Present Margins: All margins negative for DCIS Specify Closest Margin (required only if <32mm): 8 mm, anterior superior Regional Lymph  Nodes:      Number of Lymph Nodes Examined: 3      Number of Lymph Nodes with Micrometastases): 0      Number of Lymph Nodes with Isolated Tumor Cells (=0.2 mm or =200 cells): 0      Extranodal Extension: N/A  Pathologic Stage Classification (pTNM, AJCC 8th Edition): pTis, pN0    01/14/2024 Cancer Staging   Staging form: Breast, AJCC 8th Edition - Clinical stage from 01/14/2024: Stage 0 (cTis (DCIS), cN0, cM0, G3, ER-, PR-, HER2: Not Assessed) - Signed by Cindie Crumbly, MD on 01/29/2024 Stage prefix: Initial diagnosis Histologic grading system: 3 grade system       History of Presenting Illness: Melissa Rich 73 y.o. female is here for DCIS.  Patient is following after mastectomy done on 01/14/2024.  She recovered well from surgery and is getting her staples removed today.  She has no complaints today.  She reported that her DCIS was identified on mammogram after which she had a biopsy done followed by mastectomy.  She is very compliant with yearly milligrams.  We discussed that she does have high risk DCIS but her ER and PR receptors are negative and hence would not qualify for treatment with malignant, she will not even require radiation treatment.  She denies any current pain or discharge.  Patient is a non-smoker, does not drink alcohol.  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  Has no family history of breast cancer.  Medical History: Past Medical History:  Diagnosis Date   Hypertension    Macular degeneration of both eyes    Medical history non-contributory  Surgical history: Past Surgical History:  Procedure Laterality Date   ABDOMINAL HYSTERECTOMY  1995   AXILLARY SENTINEL NODE BIOPSY Right 01/14/2024   Procedure: BIOPSY, LYMPH NODE, SENTINEL, AXILLARY;  Surgeon: Lucretia Roers, MD;  Location: [facility];  Service: General;  Laterality: Right;   BREAST BIOPSY Right 12/10/2023   MM RT BREAST BX W LOC DEV EA AD LESION IMG BX SPEC STEREO GUIDE 12/10/2023 [facility]    BREAST BIOPSY Right 12/10/2023   MM RT BREAST BX W LOC DEV 1ST LESION IMAGE BX SPEC STEREO GUIDE 12/10/2023 [facility]   COLONOSCOPY N/A 07/20/2013   Procedure: COLONOSCOPY;  Surgeon: Dalia Heading, MD;  Location: [facility];  Service: Gastroenterology;  Laterality: N/A;   COLONOSCOPY WITH PROPOFOL N/A 07/17/2023   Procedure: COLONOSCOPY WITH PROPOFOL;  Surgeon: Corbin Ade, MD;  Location: [facility];  Service: Endoscopy;  Laterality: N/A;  915am, asa 2   POLYPECTOMY  07/17/2023   Procedure: POLYPECTOMY;  Surgeon: Corbin Ade, MD;  Location: [facility];  Service: Endoscopy;;   TOTAL MASTECTOMY Right 01/14/2024   Procedure: MASTECTOMY, SIMPLE;  Surgeon: Lucretia Roers, MD;  Location: [facility];  Service: General;  Laterality: Right;     Allergies:  is allergic to codeine.  Medications:  Current Outpatient Medications  Medication Sig Dispense Refill   alendronate (FOSAMAX) 70 MG tablet Take 70 mg by mouth once a week. Take with a full glass of water on an empty stomach.     atorvastatin (LIPITOR) 10 MG tablet Take 10 mg by mouth daily.     CALCIUM PO Take 1 tablet by mouth daily.     cholecalciferol (VITAMIN D3) 25 MCG (1000 UNIT) tablet Take 1,000 Units by mouth daily.     hydrochlorothiazide (HYDRODIURIL) 12.5 MG tablet Take 12.5 mg by mouth daily. At night     loratadine (CLARITIN) 10 MG tablet Take 10 mg by mouth daily as needed for allergies.     losartan (COZAAR) 50 MG tablet Take 50 mg by mouth daily. At night     Omega-3 Fatty Acids (FISH OIL) 1000 MG CAPS Take 1,000 mg by mouth daily.     ondansetron (ZOFRAN-ODT) 4 MG disintegrating tablet Take 1 tablet (4 mg total) by mouth every 6 (six) hours as needed for nausea. 20 tablet 0   oxyCODONE (OXY IR/ROXICODONE) 5 MG immediate release tablet Take 1 tablet (5 mg total) by mouth every 4 (four) hours as needed for severe pain (pain score 7-10) or breakthrough pain. 10 tablet 0   vitamin C (ASCORBIC ACID) 250 MG tablet  Take 250 mg by mouth daily.     No current facility-administered medications for this visit.    Review of Systems: Constitutional: Denies fevers, chills or abnormal night sweats Eyes: Denies blurriness of vision, double vision or watery eyes Ears, nose, mouth, throat, and face: Denies mucositis or sore throat Respiratory: Denies cough, dyspnea or wheezes Cardiovascular: Denies palpitation, chest discomfort or lower extremity swelling Gastrointestinal:  Denies nausea, heartburn or change in bowel habits Skin: Denies abnormal skin rashes Lymphatics: Denies new lymphadenopathy or easy bruising Neurological:Denies numbness, tingling or new weaknesses Behavioral/Psych: Mood is stable, no new changes  All other systems were reviewed with the patient and are negative.  Physical Examination: ECOG PERFORMANCE STATUS: 0 - Asymptomatic  Vitals:   01/29/24 0947  BP: (!) 153/63  Pulse: 92  Resp: 18  Temp: 98.1 F (36.7 C)  SpO2: 100%   Filed Weights  01/29/24 0947  Weight: 176 lb 6.4 oz (80 kg)    GENERAL:alert, no distress and comfortable SKIN: skin color, texture, turgor are normal, no rashes or significant lesions EYES: normal, conjunctiva are pink and non-injected, sclera clear BREAST: Right breast mastectomy, staples present.  No discharge, some swelling in the right axilla.  Left breast exam normal, no axillary lymphadenopathy LUNGS: clear to auscultation and percussion with normal breathing effort HEART: regular rate & rhythm and no murmurs and no lower extremity edema ABDOMEN:abdomen soft, non-tender and normal bowel sounds Musculoskeletal:no cyanosis of digits and no clubbing  PSYCH: alert & oriented x 3 with fluent speech  Laboratory Data: I have reviewed the data as listed Lab Results  Component Value Date   WBC 7.3 01/16/2024   HGB 11.5 (L) 01/16/2024   HCT 35.9 (L) 01/16/2024   MCV 93.5 01/16/2024   PLT 285 01/16/2024   Recent Labs    01/13/24 0952  01/15/24 0416 01/16/24 0418  NA 138 131* 132*  K 3.5 3.4* 3.7  CL 101 98 99  CO2 28 25 26   GLUCOSE 95 140* 94  BUN 11 14 19   CREATININE 0.76 0.83 0.94  CALCIUM 9.3 7.8* 8.3*  GFRNONAA >60 >60 >60    Radiographic Studies: I have personally reviewed the radiological images as listed and agreed with the findings in the report.  MM RT BREAST BX W LOC DEV 1ST LESION IMAGE BX SPEC STEREO GUIDE Addendum: ADDENDUM REPORT: 12/11/2023 10:58   ADDENDUM:  Pathology revealed: DUCTAL CARCINOMA IN SITU, HIGH NUCLEAR GRADE,  NECROSIS: PRESENT, CALCIFICATIONS: PRESENT of the RIGHT breast,  upper inner quadrant, posterior, (X clip). This was found to be  concordant by Dr. Quincy Carnes.   Pathology revealed: DUCTAL CARCINOMA IN SITU, HIGH NUCLEAR GRADE,  NECROSIS: PRESENT, CALCIFICATIONS: PRESENT of the RIGHT breast,  upper inner quadrant, anterior, (ribbon clip). This was found to be  concordant by Dr. Quincy Carnes.   Pathology results were discussed with the patient by telephone. The  patient reported doing well after the biopsies with tenderness at  the sites. Post biopsy instructions and care were reviewed and  questions were answered. The patient was encouraged to call [facility] for any additional concerns. My  direct phone number was provided.   Surgical consultation request was sent to [facility] in [place], [place], per patient request, via  secure EPIC message on December 11, 2023.   Consider bilateral breast MRI for further evaluation of extent of  disease given the high grade histology.   Pathology results reported by Rene Kocher, RN on 12/11/2023.   Electronically Signed    By: Hulan Saas M.D.    On: 12/11/2023 10:58 Narrative: CLINICAL DATA:  73 year old with screening detected indeterminate calcifications involving the UPPER INNER QUADRANT of the RIGHT breast at anterior to middle depth spanning  approximately 5.2 cm. The posterior and anterior extent of the calcifications are sampled.  EXAM: RIGHT BREAST STEREOTACTIC CORE NEEDLE BIOPSY x 2  COMPARISON:  Previous exam(s).  FINDINGS: The patient and I discussed the procedure of stereotactic-guided biopsy including benefits and alternatives. We discussed the high likelihood of a successful procedure. We discussed the risks of the procedure including infection, bleeding, tissue injury, clip migration, and inadequate sampling. Informed written consent was given. The usual time out protocol was performed immediately prior to the procedure.  # 1) Posterior extent of calcifications, lesion quadrant: UPPER INNER QUADRANT.  Using sterile technique with  chlorhexidine as skin antisepsis, 1% lidocaine and 1% lidocaine with epinephrine as local anesthetic, under stereotactic tomosynthesis guidance, a 9 gauge Brevera vacuum assisted device was used to initially perform core needle biopsy of the posterior extent of the calcifications in the UPPER INNER QUADRANT using a superior approach. Specimen radiograph was performed showing calcifications in at least 3 of the 6 core samples, with microcalcifications suspected in the remaining samples. Specimens with calcifications are identified for pathology. At the conclusion of the procedure, an X shaped tissue marker clip was deployed into the biopsy cavity.  # 2) Anterior extent of calcifications, lesion quadrant: UPPER INNER QUADRANT.  Using sterile technique with chlorhexidine as skin antisepsis, 1% lidocaine and 1% lidocaine with epinephrine as local anesthetic, under stereotactic tomosynthesis guidance, a 9 gauge Brevera vacuum assisted device was used to subsequently perform core needle biopsy of the anterior extent of the calcifications in the UPPER INNER QUADRANT using a superior approach. Specimen radiograph was performed showing calcifications in at least 4 of the 7 core samples,  with microcalcifications suspected in some of the remaining samples. Specimens with calcifications are identified for pathology. At the conclusion of the procedure, a ribbon shaped tissue marker clip was deployed into the biopsy cavity.  The patient tolerated the procedures well without apparent immediate complications. Follow-up 2-view mammogram was performed in order to confirm clip placement and was dictated separately.  IMPRESSION: Stereotactic tomosynthesis guided core needle biopsy (x 2) of the posterior and anterior extent of a 5.2 cm group of calcifications involving the UPPER INNER QUADRANT of the RIGHT breast at anterior to middle depth.  Electronically Signed: By: Hulan Saas M.D. On: 12/10/2023 12:29 MM RT BREAST BX W LOC DEV EA AD LESION IMG BX SPEC STEREO GUIDE Addendum: ADDENDUM REPORT: 12/11/2023 10:58   ADDENDUM:  Pathology revealed: DUCTAL CARCINOMA IN SITU, HIGH NUCLEAR GRADE,  NECROSIS: PRESENT, CALCIFICATIONS: PRESENT of the RIGHT breast,  upper inner quadrant, posterior, (X clip). This was found to be  concordant by Dr. Quincy Carnes.   Pathology revealed: DUCTAL CARCINOMA IN SITU, HIGH NUCLEAR GRADE,  NECROSIS: PRESENT, CALCIFICATIONS: PRESENT of the RIGHT breast,  upper inner quadrant, anterior, (ribbon clip). This was found to be  concordant by Dr. Quincy Carnes.   Pathology results were discussed with the patient by telephone. The  patient reported doing well after the biopsies with tenderness at  the sites. Post biopsy instructions and care were reviewed and  questions were answered. The patient was encouraged to call [facility] for any additional concerns. My  direct phone number was provided.   Surgical consultation request was sent to [facility] in [place], [place], per patient request, via  secure EPIC message on December 11, 2023.   Consider bilateral breast MRI for further  evaluation of extent of  disease given the high grade histology.   Pathology results reported by Rene Kocher, RN on 12/11/2023.   Electronically Signed    By: Hulan Saas M.D.    On: 12/11/2023 10:58 Narrative: CLINICAL DATA:  73 year old with screening detected indeterminate calcifications involving the UPPER INNER QUADRANT of the RIGHT breast at anterior to middle depth spanning approximately 5.2 cm. The posterior and anterior extent of the calcifications are sampled.  EXAM: RIGHT BREAST STEREOTACTIC CORE NEEDLE BIOPSY x 2  COMPARISON:  Previous exam(s).  FINDINGS: The patient and I discussed the procedure of stereotactic-guided biopsy including benefits and alternatives. We discussed the high likelihood of a  successful procedure. We discussed the risks of the procedure including infection, bleeding, tissue injury, clip migration, and inadequate sampling. Informed written consent was given. The usual time out protocol was performed immediately prior to the procedure.  # 1) Posterior extent of calcifications, lesion quadrant: UPPER INNER QUADRANT.  Using sterile technique with chlorhexidine as skin antisepsis, 1% lidocaine and 1% lidocaine with epinephrine as local anesthetic, under stereotactic tomosynthesis guidance, a 9 gauge Brevera vacuum assisted device was used to initially perform core needle biopsy of the posterior extent of the calcifications in the UPPER INNER QUADRANT using a superior approach. Specimen radiograph was performed showing calcifications in at least 3 of the 6 core samples, with microcalcifications suspected in the remaining samples. Specimens with calcifications are identified for pathology. At the conclusion of the procedure, an X shaped tissue marker clip was deployed into the biopsy cavity.  # 2) Anterior extent of calcifications, lesion quadrant: UPPER INNER QUADRANT.  Using sterile technique with chlorhexidine as skin antisepsis,  1% lidocaine and 1% lidocaine with epinephrine as local anesthetic, under stereotactic tomosynthesis guidance, a 9 gauge Brevera vacuum assisted device was used to subsequently perform core needle biopsy of the anterior extent of the calcifications in the UPPER INNER QUADRANT using a superior approach. Specimen radiograph was performed showing calcifications in at least 4 of the 7 core samples, with microcalcifications suspected in some of the remaining samples. Specimens with calcifications are identified for pathology. At the conclusion of the procedure, a ribbon shaped tissue marker clip was deployed into the biopsy cavity.  The patient tolerated the procedures well without apparent immediate complications. Follow-up 2-view mammogram was performed in order to confirm clip placement and was dictated separately.  IMPRESSION: Stereotactic tomosynthesis guided core needle biopsy (x 2) of the posterior and anterior extent of a 5.2 cm group of calcifications involving the UPPER INNER QUADRANT of the RIGHT breast at anterior to middle depth.  Electronically Signed: By: Rinda Cheers M.D. On: 12/10/2023 12:29    ASSESSMENT & PLAN:  Patient is a 73 year old female following for recently diagnosed DCIS   Ductal carcinoma in situ (DCIS) of right breast High-risk DCIS due to lesion size >3 cm and grade 3.  Mastectomy performed No indication for endocrine therapy needed due to negative hormone receptors.  No indication for radiation since the patient had mastectomy.  Mastectomy reduces recurrence risk.  -Continue to follow-up with Dr. Collene Dawson -Continue annual mammograms -Will follow-up every 6 months 6 months for a breast exam and yearly mammograms in the setting of high risk for recurrence and breast cancer  The total time spent in the appointment was 40 minutes encounter with patients including review of chart and various tests results, discussions about plan of care and  coordination of care plan   All questions were answered. The patient knows to call the clinic with any problems, questions or concerns. No barriers to learning was detected.  Eduardo Grade, MD 4/17/202511:43 AM

## 2024-01-29 ENCOUNTER — Telehealth: Payer: Self-pay | Admitting: *Deleted

## 2024-01-29 ENCOUNTER — Inpatient Hospital Stay

## 2024-01-29 ENCOUNTER — Inpatient Hospital Stay: Attending: Oncology | Admitting: Oncology

## 2024-01-29 VITALS — BP 153/63 | HR 92 | Temp 98.1°F | Resp 18 | Ht 63.0 in | Wt 176.4 lb

## 2024-01-29 DIAGNOSIS — Z9011 Acquired absence of right breast and nipple: Secondary | ICD-10-CM | POA: Insufficient documentation

## 2024-01-29 DIAGNOSIS — D0511 Intraductal carcinoma in situ of right breast: Secondary | ICD-10-CM | POA: Insufficient documentation

## 2024-01-29 NOTE — Telephone Encounter (Signed)
 Surgical Date: 01/14/2024 Procedure: Simple Mastectomy, Sentinel Lymph Node Bx  Patient seen in office for removal of remaining staples from right breast excision.   Incision noted to be healing as expected. No redness to surrounding skin, warmth to touch or drainage noted. No signs of wound dehiscence. No edema noted. Patient continues to use binder for compression.  Patient tolerated procedure well. Applied Steri Strips to incision for added protection. Advised that Steri Strips will come off in a few days on their own. Advised that she may shower, but to avoid submerging in water.   Patient has follow up with Dr. Collene Dawson on 02/05/2024. Advised to contact office with any questions or concerns.

## 2024-01-29 NOTE — Patient Instructions (Addendum)
 [facility]  Discharge Instructions  You were seen and examined today by Dr. Orvis Blare. Dr. Orvis Blare is a medical oncologist, meaning that he specializes in the treatment of cancer diagnoses. Dr. Orvis Blare discussed your past medical history, family history of cancers, and the events that led to you being here today.  You were referred to Dr. Orvis Blare due to a recent diagnosis of ductal carcinoma in situ (DCIS). DCIS is a pre-breast cancer that, without treatment, does result in invasive breast cancer. Your DCIS was removed during surgery. The goal of treatment now is to prevent the DCIS from returning and preventing the development of invasive breast cancer.  You will continue yearly mammograms and routine follow-up.  Thank you for choosing [facility] to provide your oncology and hematology care.   To afford each patient quality time with our provider, please arrive at least 15 minutes before your scheduled appointment time. You may need to reschedule your appointment if you arrive late (10 or more minutes). Arriving late affects you and other patients whose appointments are after yours.  Also, if you miss three or more appointments without notifying the office, you may be dismissed from the clinic at the provider's discretion.    Again, thank you for choosing [facility].  Our hope is that these requests will decrease the amount of time that you wait before being seen by our physicians.   If you have a lab appointment with the Cancer Center - please note that after April 8th, all labs will be drawn in the cancer center.  You do not have to check in or register with the main entrance as you have in the past but will complete your check-in at the cancer center.            _____________________________________________________________  Should you have questions after your visit to [facility], please contact our office at  [phone] and follow the prompts.  Our office hours are 8:00 a.m. to 4:30 p.m. Monday - Thursday and 8:00 a.m. to 2:30 p.m. Friday.  Please note that voicemails left after 4:00 p.m. may not be returned until the following business day.  We are closed weekends and all major holidays.  You do have access to a nurse 24-7, just call the main number to the clinic [phone] and do not press any options, hold on the line and a nurse will answer the phone.    For prescription refill requests, have your pharmacy contact our office and allow 72 hours.    Masks are no longer required in the cancer centers. If you would like for your care team to wear a mask while they are taking care of you, please let them know. You may have one support person who is at least 71 years old accompany you for your appointments.

## 2024-01-29 NOTE — Assessment & Plan Note (Signed)
 High-risk DCIS due to lesion size >3 cm and grade 3.  Mastectomy performed No indication for endocrine therapy needed due to negative hormone receptors.  No indication for radiation since the patient had mastectomy.  Mastectomy reduces recurrence risk.  -Continue to follow-up with Dr. Collene Dawson -Continue annual mammograms -Will follow-up every 6 months 6 months for a breast exam and yearly mammograms in the setting of high risk for recurrence and breast cancer

## 2024-02-05 ENCOUNTER — Ambulatory Visit (INDEPENDENT_AMBULATORY_CARE_PROVIDER_SITE_OTHER): Admitting: General Surgery

## 2024-02-05 ENCOUNTER — Encounter: Payer: Self-pay | Admitting: General Surgery

## 2024-02-05 VITALS — BP 124/77 | HR 66 | Temp 98.3°F | Resp 12 | Ht 63.0 in | Wt 175.0 lb

## 2024-02-05 DIAGNOSIS — D0511 Intraductal carcinoma in situ of right breast: Secondary | ICD-10-CM

## 2024-02-05 NOTE — Patient Instructions (Signed)
 Follow up annually for the left breast mammogram 11/2024. Follow up with  Oncology as scheduled. Steristrips can be removed once they are peeling off. It is ok to shower. Pat the area dry.  Activity as tolerated Ok to drive

## 2024-02-06 NOTE — Progress Notes (Signed)
 [facility]  Staples out last week. No issues. Steri strips peeling off some.   BP 124/77   Pulse 66   Temp 98.3 F (36.8 C) (Oral)   Resp 12   Ht 5\' 3"  (1.6 m)   Wt 175 lb (79.4 kg)   SpO2 96%   BMI 31.00 kg/m  Steri strips peeling. No erythema or drainage Some minor swelling   Patient s/p right mastectomy, SLNB for high grade DCIS. Doing well. Has seen Oncology.  Follow up with Oncology Follow up mammogram left side 11/2024 Steristrips will peel off in the next 5-7 days. You can remove them once they are peeling off. It is ok to shower. Pat the area dry.  activity as tolerated  Ok to drive   Future Appointments  Date Time Provider Department Center  07/30/2024  8:30 AM Burns, Cosette Dinning, NP [facility] None     Deena Farrier, MD [facility] [address] [place] [place], [place] [address] [phone] (office)

## 2024-03-01 ENCOUNTER — Telehealth: Payer: Self-pay | Admitting: *Deleted

## 2024-03-01 NOTE — Telephone Encounter (Signed)
 Surgical Date: 01/14/2024 Procedure: Mastectomy, Simple, Right   Sentinel Lymph Node Biopsy, Axilla Right  Received call from patient [phone]~ telephone.   Patient reports that she has received mastectomy bra and breast form. States that she has worn bra x1 day. Reports that after wearing the bra with the form, her incision is red and warm to touch.   States that there is no swelling or drainage from the incision. States that she has no fever. States that she feels that bra or breast form caused irritation to incision.   Advised to not wear bra or form until irritation has settled. Advised to make sure silicon form is covered by breathable material like cotton so that it does not stick to skin or irritate it.   States that she will continue to monitor and will call with any changes.

## 2024-03-15 ENCOUNTER — Ambulatory Visit (INDEPENDENT_AMBULATORY_CARE_PROVIDER_SITE_OTHER): Admitting: General Surgery

## 2024-03-15 ENCOUNTER — Encounter: Payer: Self-pay | Admitting: General Surgery

## 2024-03-15 VITALS — BP 137/85 | HR 79 | Temp 98.5°F | Resp 16 | Ht 63.0 in | Wt 171.0 lb

## 2024-03-15 DIAGNOSIS — D0511 Intraductal carcinoma in situ of right breast: Secondary | ICD-10-CM

## 2024-03-15 NOTE — Patient Instructions (Signed)
 Continue to try to use your prosthetic little by little, increasing the time you wear it. You can try to wrap it with cloth or even panty hose to see if that helps keep you from reacting to the material. Call with any questions or concerns. Area of the mast ectomy still swollen and can take 6 months+ to fully resolve.

## 2024-03-15 NOTE — Telephone Encounter (Signed)
 Surgical Date: 01/14/2024 Procedure: Mastectomy, Simple, Right              Sentinel Lymph Node Biopsy, Axilla Right   Received call from patient [phone]~ telephone.   Patient reports that irritation to incision has not resolved. States that she has not been wearing breast form due to irritation. Denies drainage.   Appointment scheduled for evaluation.

## 2024-03-15 NOTE — Progress Notes (Signed)
 [facility]  Patient reporting some redness on her chest wall after wearing her silicone prosthetic. She says she wore it several days in a row and that since then she has not worn it. The area is now not red.  BP 137/85   Pulse 79   Temp 98.5 F (36.9 C) (Oral)   Resp 16   Ht 5\' 3"  (1.6 m)   Wt 171 lb (77.6 kg)   SpO2 97%   BMI 30.29 kg/m  Mastectomy site on the right, swollen esp laterally. No signs of erythema, incision well healed   Patient s/p right mastectomy for DCIS. Doing well but did have some issues with redness after wearing the prosthetic.  Continue to try to use your prosthetic little by little, increasing the time you wear it. You can try to wrap it with cloth or even panty hose to see if that helps keep you from reacting to the material. Call with any questions or concerns. Area of the mast ectomy still swollen and can take 6 months+ to fully resolve.   Deena Farrier, MD [facility] [address] [place], [place] [address] [phone] (office)

## 2024-07-01 ENCOUNTER — Telehealth: Payer: Self-pay | Admitting: *Deleted

## 2024-07-01 NOTE — Telephone Encounter (Signed)
 Surgical Date: 01/14/2024 Procedure: Mastectomy, Simple, Right/ Sentinel Lymph Node Biopsy, Axilla Right  Received call from patient [phone]~ telephone.   Patient reports that she still has increased swelling in her arm. States that she attempted to use compression sleeve, but thinks that she is allergic to it.   States that she would like to have provider evaluate.   Appointment scheduled.

## 2024-07-13 ENCOUNTER — Ambulatory Visit: Admitting: General Surgery

## 2024-07-13 ENCOUNTER — Encounter: Payer: Self-pay | Admitting: General Surgery

## 2024-07-13 ENCOUNTER — Other Ambulatory Visit: Payer: Self-pay

## 2024-07-13 VITALS — BP 115/76 | HR 68 | Temp 98.2°F | Resp 18 | Ht 63.0 in | Wt 172.0 lb

## 2024-07-13 DIAGNOSIS — I890 Lymphedema, not elsewhere classified: Secondary | ICD-10-CM | POA: Diagnosis not present

## 2024-07-13 NOTE — Patient Instructions (Addendum)
 Will get you referred for some lymphedema of the right arm after breast surgery. You could try another compression garment keri of a different material.  The therapist will work with you show you exercises or things you can do that may help the area.   If you have not heard back from some by next week, give us  a call.

## 2024-07-14 NOTE — Progress Notes (Signed)
 [facility]  Doing fair but noting some right arm swelling. No pain noted.  BP 115/76   Pulse 68   Temp 98.2 F (36.8 C) (Oral)   Resp 18   Ht 5' 3 (1.6 m)   Wt 172 lb (78 kg)   SpO2 96%   BMI 30.47 kg/m  Mastectomy site healed, some excess side fat on the lateral edge, axilla soft  Right arm- Wrist 17.5cm, below elbow 26cm, above elbow 27.5cm  Left arm -Wrist 16.5cm, Below elbow- 25.75cm, above elbow 27  No obvious pitting   Patient with some swelling based on the measurements, no obvious pitting. She says it improves with elevation but she is unable to wear a garment due to an allergy to the one she has currently.   Will get you referred for some lymphedema of the right arm after breast surgery. You could try another compression garment keri of a different material.  The therapist will work with you show you exercises or things you can do that may help the area.   If you have not heard back from some by next week, give us  a call.   Manuelita Pander, MD [facility] [address] [place] [address], [place] [address] [phone] (office)

## 2024-07-15 NOTE — Therapy (Unsigned)
 OUTPATIENT PHYSICAL THERAPY LYMPHEDEMA EVALUATION  Patient Name: Melissa Rich MRN: [id number] DOB:December 03, 1950, 73 y.o., female Today's Date: 07/16/2024  END OF SESSION:  PT End of Session - 07/16/24 1145     Visit Number 1    Number of Visits 9    Authorization Type U[employer] auth placed    Progress Note Due on Visit 9    PT Start Time 0815    PT Stop Time 0910    PT Time Calculation (min) 55 min          Past Medical History:  Diagnosis Date   Hypertension    Macular degeneration of both eyes    Medical history non-contributory    Past Surgical History:  Procedure Laterality Date   ABDOMINAL HYSTERECTOMY  1995   AXILLARY SENTINEL NODE BIOPSY Right 01/14/2024   Procedure: BIOPSY, LYMPH NODE, SENTINEL, AXILLARY;  Surgeon: Kallie Manuelita BROCKS, MD;  Location: [facility];  Service: General;  Laterality: Right;   BREAST BIOPSY Right 12/10/2023   MM RT BREAST BX W LOC DEV EA AD LESION IMG BX SPEC STEREO GUIDE 12/10/2023 [facility]   BREAST BIOPSY Right 12/10/2023   MM RT BREAST BX W LOC DEV 1ST LESION IMAGE BX SPEC STEREO GUIDE 12/10/2023 [facility]   COLONOSCOPY N/A 07/20/2013   Procedure: COLONOSCOPY;  Surgeon: Oneil DELENA Budge, MD;  Location: [facility];  Service: Gastroenterology;  Laterality: N/A;   COLONOSCOPY WITH PROPOFOL  N/A 07/17/2023   Procedure: COLONOSCOPY WITH PROPOFOL ;  Surgeon: Shaaron Lamar HERO, MD;  Location: [facility];  Service: Endoscopy;  Laterality: N/A;  915am, asa 2   POLYPECTOMY  07/17/2023   Procedure: POLYPECTOMY;  Surgeon: Shaaron Lamar HERO, MD;  Location: [facility];  Service: Endoscopy;;   TOTAL MASTECTOMY Right 01/14/2024   Procedure: MASTECTOMY, SIMPLE;  Surgeon: Kallie Manuelita BROCKS, MD;  Location: [facility];  Service: General;  Laterality: Right;   Patient Active Problem List   Diagnosis Date Noted   Lymphedema of right upper extremity 07/13/2024   Hyponatremia 01/16/2024   Postoperative anemia due to acute blood loss 01/16/2024   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 01/14/2024   Ductal carcinoma in situ (DCIS) of right breast 12/25/2023   Encounter for screening for other suspected endocrine disorder 04/09/2022   Long term current use of therapeutic drug 04/09/2022   Senile osteoporosis 04/09/2022   Macular cyst, hole, or pseudohole, right eye 03/18/2017   Mixed hyperlipidemia 03/18/2017   Vitamin D  deficiency 03/18/2017   Essential hypertension 01/07/2017   Lung field abnormal 01/07/2017   Simple obesity 01/07/2017   Other and unspecified ovarian cyst 03/24/2014    PCP: Benjamin Gather  REFERRING PROVIDER: Kallie Manuelita BROCKS, MD  REFERRING DIAG: I89.0 (ICD-10-CM) - Lymphedema of right upper extremity  THERAPY DIAG:  Postmastectomy lymphedema  Rationale for Evaluation and Treatment: Rehabilitation  ONSET DATE: 01/14/24  SUBJECTIVE:  SUBJECTIVE STATEMENT: Pt states that her right arm is swollen.  She had a breast form and it got red and swelled up.  She then got a sleeve when she noted that she had some swelling in her right arm in August but she broke out in a rash; they now believe that she is allergic to the silicone.   Pt states that she has a sleeve and is allergic to it.    PERTINENT HISTORY: Per MD      Oncology History  Ductal carcinoma in situ (DCIS) of right breast  12/02/2023 Mammogram    IMPRESSION: Indeterminate linear oriented 5.2 cm group of calcifications in the upper slightly inner right breast.    12/10/2023 Pathology Results    FINAL DIAGNOSIS    1. Breast, right, needle core biopsy, upper inner quadrant, posterior (x clip): DUCTAL CARCINOMA IN SITU, HIGH NUCLEAR GRADE  NECROSIS: PRESENT  CALCIFICATIONS: PRESENT  DCIS LENGTH: 0.4 CM       2. Breast, right, needle core  biopsy, upper inner quadrant, anterior (ribbon clip) :  DUCTAL CARCINOMA IN SITU, HIGH NUCLEAR GRADE  NECROSIS: PRESENT  CALCIFICATIONS: PRESENT  DCIS LENGTH: 0.2 CM       12/25/2023 Initial Diagnosis    Ductal carcinoma in situ (DCIS) of right breast    01/14/2024 Pathology Results    FINAL MICROSCOPIC DIAGNOSIS:  A. BREAST, RIGHT, MASTECTOMY: Ductal carcinoma in situ, solid type with comedonecrosis, nuclear grade 3 of 3 DCIS greatest dimension: 4.6 x 3.9 x 3.2 cm Margins: negative Closest margin: 0.8 cm anterior-superior Prognostic markers: ER negative, PR negative Biopsy site clips Other findings: N/A See oncology table  B. SENTINEL LYMPH NODE, RIGHT AXILLARY, EXCISION: -  One benign lymph node (0/1).  C. SENTINEL LYMPH NODE, RIGHT AXILLARY, EXCISION: -  One benign lymph node (0/1).  D. SENTINEL LYMPH NODE, RIGHT AXILLARY, EXCISION: -  One benign lymph node (0/1).  ONCOLOGY TABLE: Histologic Type: Ductal carcinoma in situ Size of DCIS: 4.6 x 3.9 x 3.2 cm Nuclear Grade: 3/3 Necrosis: Present Margins: All margins negative for DCIS Specify Closest Margin (required only if <52mm): 8 mm, anterior superior Regional Lymph Nodes:      Number of Lymph Nodes Examined: 3      Number of Lymph Nodes with Micrometastases): 0      Number of Lymph Nodes with Isolated Tumor Cells (=0.2 mm or =200 cells): 0      Extranodal Extension: N/A  Pathologic Stage Classification (pTNM, AJCC 8th Edition): pTis, pN0     01/14/2024 Cancer Staging    Staging form: Breast, AJCC 8th Edition - Clinical stage from 01/14/2024: Stage 0 (cTis (DCIS), cN0, cM0, G3, ER-, PR-, HER2: Not Assessed) - Signed by Davonna Siad, MD on 01/29/2024 Stage prefix: Initial diagnosis Histologic grading system: 3 grade system        PAIN:  Are you having pain? Yes NPRS scale: 1/10 Pain location: Right elbow Pain orientation: Right  PAIN TYPE: aching Pain description: intermittent  Aggravating factors: morning   Relieving factors: movement   PRECAUTIONS: Other: cellulitis  RED FLAGS: None   WEIGHT BEARING RESTRICTIONS: No  FALLS:  Has patient fallen in last 6 months? No  LIVING ENVIRONMENT: Lives with: lives with their family Lives in: House/apartment  LEISURE: retired  HAND DOMINANCE: right   PRIOR LEVEL OF FUNCTION: Independent  PATIENT GOALS: keep the swelling down    OBJECTIVE: Note: Objective measures were completed at Evaluation unless otherwise noted.  COGNITION: Overall cognitive status:  Within functional limits for tasks assessed   PALPATION: No induration   OBSERVATIONS / OTHER ASSESSMENTS: minimal swelling    UPPER EXTREMITY AROM/PROM:  All wnl    LYMPHEDEMA ASSESSMENTS:   SURGERY TYPE/DATE: 01/14/24 Rt mastectomy  NUMBER OF LYMPH NODES REMOVED: 1  CHEMOTHERAPY: none  RADIATION:none  HORMONE TREATMENT: none   LYMPHEDEMA ASSESSMENTS: Pt is right handed    LANDMARK RIGHT   eval  Axillary  35.9  10 cm proximal to olecranon process 30.3  Olecranon process 27  10 cm proximal to ulnar styloid process 22.2  Just proximal to ulnar styloid process 17.8  Across hand at thumb web space 19.8  At base of index 7.5  thumb 6.5  (Blank rows = not tested)  ARM length. subaxillary to wrist: 39.5; to MCP 47  LANDMARK LEFT   eval  Axillary 35.3  10 cm proximal to olecranon process 30.3  Olecranon process 26.2   10 cm proximal to ulnar styloid process 21.8  Just proximal to ulnar styloid process 17  Across hand at thumb web space 19.2  At base of index digit 7.3  thumb 6.3  (Blank rows = not tested)  TODAY'S TREATMENT:                                                                                                                              DATE:  07/16/24:  Explanation of what lymphedema is and how it is controlled to include skin care, exercise, manual and compression.   Therapist talked about manual but no education on self manual due to time  restraints.  Therapist measured for compression garment.  Education on HEP as well as self manual.     PATIENT EDUCATION:  Education details: see below  Person educated: Patient Education method: Programmer, multimedia, Demonstration, Verbal cues, and Handouts Education comprehension: verbalized understanding and returned demonstration  HOME EXERCISE PROGRAM: Access Code: [email or web address] URL: [email or web address] Date: 07/16/2024 Prepared by: Montie Metro  Exercises - Seated Diaphragmatic Breathing  - 1 x daily - 7 x weekly - 10 reps - 5 hold - Seated Cervical Sidebending AROM  - 1 x daily - 7 x weekly - 1 sets - 10 reps - 2-3 hold - Seated Cervical Rotation AROM  - 1 x daily - 7 x weekly - 1 sets - 10 reps - 2-3 hold - Seated Cervical Extension AROM  - 1 x daily - 7 x weekly - 1 sets - 10 reps - 2-3 hold - Seated Cervical Retraction  - 1 x daily - 7 x weekly - 1 sets - 10 reps - 2-3 hold - Shoulder Rolls in Sitting  - 1 x daily - 7 x weekly - 1 sets - 10 reps - 2-3 hold - Seated Sidebending Arms Overhead  - 1 x daily - 7 x weekly - 1 sets - 10 reps - 2-3 hold - Seated Shoulder Flexion Full Range  - 1 x daily - 7 x  weekly - 1 sets - 10 reps - Seated Shoulder Horizontal Abduction - Thumbs Up  - 1 x daily - 7 x weekly - 1 sets - 10 reps - Seated Shoulder Abduction Full Range  - 1 x daily - 7 x weekly - 1 sets - 10 reps - Seated Biceps Curl  - 1 x daily - 7 x weekly - 1 sets - 10 reps - Seated Single Arm Bicep Curls Supinated with Dumbbell  - 1 x daily - 7 x weekly - 1 sets - 10 reps - Seated Wrist Flexion Active Stretch Pronated with Elbow Straight  - 1 x daily - 7 x weekly - 1 sets - 10 reps - Hand AROM Flat Fist  - 1 x daily - 7 x weekly - 1 sets - 10 reps  ASSESSMENT:  CLINICAL IMPRESSION: Patient is a 73 y.o. female who was seen today for physical therapy evaluation and treatment for Rt UE lymphedema.  At this time it is grade II lymphedema.  The pt was given a sleeve,  however, she is allergic to it.  PT no knowledge of lymphedema therefore the therapist educated pt on lymphedema and how it is controlled. That it is a chronic condition which will progress if not addressed.  Pt educated on skin care, exercise and measured for a sleeve that will be silicone/latex free.  Due to time restraints pt was unable to be educated on self manual techniques.  Therapist ordered a silicon free/latex free sleeve with gauntlet.     OBJECTIVE IMPAIRMENTS: increased edema and impaired flexibility.   ACTIVITY LIMITATIONS: carrying and lifting  PARTICIPATION LIMITATIONS: cleaning and laundry  REHAB POTENTIAL: Good  CLINICAL DECISION MAKING: Evolving/moderate complexity  EVALUATION COMPLEXITY: Moderate  GOALS: Goals reviewed with patient? Yes  SHORT TERM GOALS: Target date: 10/15/125  Pt to be I in HEP to improve lymphatic circulation  Baseline: Goal status: INITIAL  2.  Pt  measurement to be decreased by one cm. Baseline:  Goal status: INITIAL    LONG TERM GOALS: Target date: 08/06/24  PT to be I in self manual techniques  Baseline:  Goal status: INITIAL  2.  Pt to have received and be donning her new compression sleeve.  Baseline:  Goal status: INITIAL   PLAN:  PT FREQUENCY: 3x/week  PT DURATION: 3 weeks  PLANNED INTERVENTIONS: 97110-Therapeutic exercises, 97535- Self Care, 02859- Manual therapy, and Patient/Family education  PLAN FOR NEXT SESSION: begin manual techniques educating pt on self manual.    Montie Metro, PT CLT [phone]  07/16/2024, 11:46 AM    [employer] Medicare Auth Request Information Treatment Start Date: 07/16/24  Date of referral: 07/13/24 Referring provider: Manuelita Pander  Referring diagnosis (ICD 10)? I89.0 (ICD-10-CM) - Lymphedema of right upper extremity Treatment diagnosis (ICD 10)? (if different than referring diagnosis) I97.2  What was this (referring dx) caused by? Surgery (Type: mastectomy)  Lysle of  Condition: Chronic (continuous duration > 3 months)   Laterality: Rt    Objective measurements identify impairments when they are compared to normal values, the uninvolved extremity, and prior level of function.  [x]  Yes  []  No  Objective assessment of functional ability: Minimal functional limitations   Briefly describe symptoms: noted increased swelling in her hand and forearm following surgery  How did symptoms start: slowly  Average pain intensity:  Last 24 hours: 1  Past week: 1  How often does the pt experience symptoms? Constantly  How much have the symptoms interfered with usual daily activities?  A little bit  How has condition changed since care began at this facility? NA - initial visit  In general, how is the patients overall health? Good

## 2024-07-16 ENCOUNTER — Other Ambulatory Visit: Payer: Self-pay

## 2024-07-16 ENCOUNTER — Ambulatory Visit (HOSPITAL_COMMUNITY): Attending: General Surgery | Admitting: Physical Therapy

## 2024-07-16 DIAGNOSIS — I890 Lymphedema, not elsewhere classified: Secondary | ICD-10-CM | POA: Diagnosis not present

## 2024-07-16 DIAGNOSIS — I972 Postmastectomy lymphedema syndrome: Secondary | ICD-10-CM | POA: Insufficient documentation

## 2024-07-20 ENCOUNTER — Encounter (HOSPITAL_COMMUNITY): Admitting: Physical Therapy

## 2024-07-20 ENCOUNTER — Ambulatory Visit (HOSPITAL_COMMUNITY): Admitting: Physical Therapy

## 2024-07-20 DIAGNOSIS — I972 Postmastectomy lymphedema syndrome: Secondary | ICD-10-CM

## 2024-07-20 NOTE — Therapy (Addendum)
 OUTPATIENT PHYSICAL THERAPY LYMPHEDEMA Treatment  Patient Name: Melissa Rich MRN: [id number] DOB:1951-07-03, 73 y.o., female Today's Date: 07/20/2024  END OF SESSION:  PT End of Session - 07/20/24 1441     Visit Number 2    Number of Visits 9    Date for Recertification  08/06/24    Authorization Type [employer]    Authorization Time Period 10/3-10/24    Authorization - Visit Number 2    Authorization - Number of Visits 9    Progress Note Due on Visit 9    PT Start Time 1355    PT Stop Time 1442    PT Time Calculation (min) 47 min    Activity Tolerance Patient tolerated treatment well    Behavior During Therapy WFL for tasks assessed/performed           Past Medical History:  Diagnosis Date   Hypertension    Macular degeneration of both eyes    Medical history non-contributory    Past Surgical History:  Procedure Laterality Date   ABDOMINAL HYSTERECTOMY  1995   AXILLARY SENTINEL NODE BIOPSY Right 01/14/2024   Procedure: BIOPSY, LYMPH NODE, SENTINEL, AXILLARY;  Surgeon: Kallie Manuelita BROCKS, MD;  Location: [facility];  Service: General;  Laterality: Right;   BREAST BIOPSY Right 12/10/2023   MM RT BREAST BX W LOC DEV EA AD LESION IMG BX SPEC STEREO GUIDE 12/10/2023 [facility]   BREAST BIOPSY Right 12/10/2023   MM RT BREAST BX W LOC DEV 1ST LESION IMAGE BX SPEC STEREO GUIDE 12/10/2023 [facility]   COLONOSCOPY N/A 07/20/2013   Procedure: COLONOSCOPY;  Surgeon: Oneil DELENA Budge, MD;  Location: [facility];  Service: Gastroenterology;  Laterality: N/A;   COLONOSCOPY WITH PROPOFOL  N/A 07/17/2023   Procedure: COLONOSCOPY WITH PROPOFOL ;  Surgeon: Shaaron Lamar HERO, MD;  Location: [facility];  Service: Endoscopy;  Laterality: N/A;  915am, asa 2   POLYPECTOMY  07/17/2023   Procedure: POLYPECTOMY;  Surgeon: Shaaron Lamar HERO, MD;  Location: [facility];  Service: Endoscopy;;   TOTAL MASTECTOMY Right 01/14/2024   Procedure: MASTECTOMY, SIMPLE;  Surgeon: Kallie Manuelita BROCKS, MD;   Location: [facility];  Service: General;  Laterality: Right;   Patient Active Problem List   Diagnosis Date Noted   Lymphedema of right upper extremity 07/13/2024   Hyponatremia 01/16/2024   Postoperative anemia due to acute blood loss 01/16/2024   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 01/14/2024   Ductal carcinoma in situ (DCIS) of right breast 12/25/2023   Encounter for screening for other suspected endocrine disorder 04/09/2022   Long term current use of therapeutic drug 04/09/2022   Senile osteoporosis 04/09/2022   Macular cyst, hole, or pseudohole, right eye 03/18/2017   Mixed hyperlipidemia 03/18/2017   Vitamin D  deficiency 03/18/2017   Essential hypertension 01/07/2017   Lung field abnormal 01/07/2017   Simple obesity 01/07/2017   Other and unspecified ovarian cyst 03/24/2014    PCP: Benjamin Gather  REFERRING PROVIDER: Kallie Manuelita BROCKS, MD  REFERRING DIAG: I89.0 (ICD-10-CM) - Lymphedema of right upper extremity  THERAPY DIAG:  Postmastectomy lymphedema  Rationale for Evaluation and Treatment: Rehabilitation  ONSET DATE: 01/14/24  SUBJECTIVE:  SUBJECTIVE STATEMENT: Pt states that she did her exercises.  PT has minimal questions on exercises.  PERTINENT HISTORY: Per MD      Oncology History  Ductal carcinoma in situ (DCIS) of right breast  12/02/2023 Mammogram    IMPRESSION: Indeterminate linear oriented 5.2 cm group of calcifications in the upper slightly inner right breast.    12/10/2023 Pathology Results    FINAL DIAGNOSIS    1. Breast, right, needle core biopsy, upper inner quadrant, posterior (x clip): DUCTAL CARCINOMA IN SITU, HIGH NUCLEAR GRADE  NECROSIS: PRESENT  CALCIFICATIONS: PRESENT  DCIS LENGTH: 0.4 CM       2. Breast, right, needle  core biopsy, upper inner quadrant, anterior (ribbon clip) :  DUCTAL CARCINOMA IN SITU, HIGH NUCLEAR GRADE  NECROSIS: PRESENT  CALCIFICATIONS: PRESENT  DCIS LENGTH: 0.2 CM       12/25/2023 Initial Diagnosis    Ductal carcinoma in situ (DCIS) of right breast    01/14/2024 Pathology Results    FINAL MICROSCOPIC DIAGNOSIS:  A. BREAST, RIGHT, MASTECTOMY: Ductal carcinoma in situ, solid type with comedonecrosis, nuclear grade 3 of 3 DCIS greatest dimension: 4.6 x 3.9 x 3.2 cm Margins: negative Closest margin: 0.8 cm anterior-superior Prognostic markers: ER negative, PR negative Biopsy site clips Other findings: N/A See oncology table  B. SENTINEL LYMPH NODE, RIGHT AXILLARY, EXCISION: -  One benign lymph node (0/1).  C. SENTINEL LYMPH NODE, RIGHT AXILLARY, EXCISION: -  One benign lymph node (0/1).  D. SENTINEL LYMPH NODE, RIGHT AXILLARY, EXCISION: -  One benign lymph node (0/1).  ONCOLOGY TABLE: Histologic Type: Ductal carcinoma in situ Size of DCIS: 4.6 x 3.9 x 3.2 cm Nuclear Grade: 3/3 Necrosis: Present Margins: All margins negative for DCIS Specify Closest Margin (required only if <70mm): 8 mm, anterior superior Regional Lymph Nodes:      Number of Lymph Nodes Examined: 3      Number of Lymph Nodes with Micrometastases): 0      Number of Lymph Nodes with Isolated Tumor Cells (=0.2 mm or =200 cells): 0      Extranodal Extension: N/A  Pathologic Stage Classification (pTNM, AJCC 8th Edition): pTis, pN0     01/14/2024 Cancer Staging    Staging form: Breast, AJCC 8th Edition - Clinical stage from 01/14/2024: Stage 0 (cTis (DCIS), cN0, cM0, G3, ER-, PR-, HER2: Not Assessed) - Signed by Davonna Siad, MD on 01/29/2024 Stage prefix: Initial diagnosis Histologic grading system: 3 grade system        PAIN:  Are you having pain? Yes NPRS scale: 1/10 Pain location: Right elbow Pain orientation: Right  PAIN TYPE: aching Pain description: intermittent  Aggravating factors:  morning  Relieving factors: movement   PRECAUTIONS: Other: cellulitis  RED FLAGS: None   WEIGHT BEARING RESTRICTIONS: No  FALLS:  Has patient fallen in last 6 months? No  LIVING ENVIRONMENT: Lives with: lives with their family Lives in: House/apartment  LEISURE: retired  HAND DOMINANCE: right   PRIOR LEVEL OF FUNCTION: Independent  PATIENT GOALS: keep the swelling down    OBJECTIVE: Note: Objective measures were completed at Evaluation unless otherwise noted.  COGNITION: Overall cognitive status: Within functional limits for tasks assessed   PALPATION: No induration   OBSERVATIONS / OTHER ASSESSMENTS: minimal swelling    UPPER EXTREMITY AROM/PROM:  All wnl    LYMPHEDEMA ASSESSMENTS:   SURGERY TYPE/DATE: 01/14/24 Rt mastectomy  NUMBER OF LYMPH NODES REMOVED: 1  CHEMOTHERAPY: none  RADIATION:none  HORMONE TREATMENT: none   LYMPHEDEMA ASSESSMENTS:  Pt is right handed    LANDMARK RIGHT   eval  Axillary  35.9  10 cm proximal to olecranon process 30.3  Olecranon process 27  10 cm proximal to ulnar styloid process 22.2  Just proximal to ulnar styloid process 17.8  Across hand at thumb web space 19.8  At base of index 7.5  thumb 6.5  (Blank rows = not tested)  ARM length. subaxillary to wrist: 39.5; to MCP 47  LANDMARK LEFT   eval  Axillary 35.3  10 cm proximal to olecranon process 30.3  Olecranon process 26.2   10 cm proximal to ulnar styloid process 21.8  Just proximal to ulnar styloid process 17  Across hand at thumb web space 19.2  At base of index digit 7.3  thumb 6.3  (Blank rows = not tested)  TODAY'S TREATMENT:                                                                                                                              DATE:  07/20/24:  Manual completed to include supraclavicular, Lt axillary, Rt inguinal, RT to Lt axillary anastomosis,  rt axillary to Rt inguinal anastomosis, both anterior and posterior followed by Rt UE  both anterior and posterior. PT instructed in self manual and given self manual sheet.  PATIENT EDUCATION:  Education details: 07/20/24 self manual Person educated: Patient Education method: Explanation, Demonstration, Verbal cues, and Handouts Education comprehension: verbalized understanding and returned demonstration  HOME EXERCISE PROGRAM: 07/20/24:  self manual sheet  Access Code: [email or web address] URL: [email or web address] Date: 07/16/2024 Prepared by: Montie Metro  Exercises - Seated Diaphragmatic Breathing  - 1 x daily - 7 x weekly - 10 reps - 5 hold - Seated Cervical Sidebending AROM  - 1 x daily - 7 x weekly - 1 sets - 10 reps - 2-3 hold - Seated Cervical Rotation AROM  - 1 x daily - 7 x weekly - 1 sets - 10 reps - 2-3 hold - Seated Cervical Extension AROM  - 1 x daily - 7 x weekly - 1 sets - 10 reps - 2-3 hold - Seated Cervical Retraction  - 1 x daily - 7 x weekly - 1 sets - 10 reps - 2-3 hold - Shoulder Rolls in Sitting  - 1 x daily - 7 x weekly - 1 sets - 10 reps - 2-3 hold - Seated Sidebending Arms Overhead  - 1 x daily - 7 x weekly - 1 sets - 10 reps - 2-3 hold - Seated Shoulder Flexion Full Range  - 1 x daily - 7 x weekly - 1 sets - 10 reps - Seated Shoulder Horizontal Abduction - Thumbs Up  - 1 x daily - 7 x weekly - 1 sets - 10 reps - Seated Shoulder Abduction Full Range  - 1 x daily - 7 x weekly - 1 sets - 10 reps - Seated Biceps Curl  - 1 x daily - 7 x  weekly - 1 sets - 10 reps - Seated Single Arm Bicep Curls Supinated with Dumbbell  - 1 x daily - 7 x weekly - 1 sets - 10 reps - Seated Wrist Flexion Active Stretch Pronated with Elbow Straight  - 1 x daily - 7 x weekly - 1 sets - 10 reps - Hand AROM Flat Fist  - 1 x daily - 7 x weekly - 1 sets - 10 reps  ASSESSMENT:  CLINICAL IMPRESSION: Therapist initiated manual techniques to improve lymphatic circulation.  Following manual therapist educated pt on self manual techniques.  PT given self manual  sheet for home use.   OBJECTIVE IMPAIRMENTS: increased edema and impaired flexibility.   ACTIVITY LIMITATIONS: carrying and lifting  PARTICIPATION LIMITATIONS: cleaning and laundry  REHAB POTENTIAL: Good  CLINICAL DECISION MAKING: Evolving/moderate complexity  EVALUATION COMPLEXITY: Moderate  GOALS: Goals reviewed with patient? Yes  SHORT TERM GOALS: Target date: 10/15/125  Pt to be I in HEP to improve lymphatic circulation  Baseline: Goal status: met  2.  Pt  measurement to be decreased by one cm. Baseline:  Goal status: on-going     LONG TERM GOALS: Target date: 08/06/24  PT to be I in self manual techniques  Baseline:  Goal status: on-going  2.  Pt to have received and be donning her new compression sleeve.  Baseline:  Goal status: on-going   PLAN:  PT FREQUENCY: 3x/week  PT DURATION: 3 weeks  PLANNED INTERVENTIONS: 97110-Therapeutic exercises, 97535- Self Care, 02859- Manual therapy, and Patient/Family education  PLAN FOR NEXT SESSION: measure, continue manual techniques answer any questions pt has.  See if pt has heard from [facility].    Montie Metro, PT CLT [phone]  07/20/2024, 2:50 PM

## 2024-07-28 ENCOUNTER — Ambulatory Visit (HOSPITAL_COMMUNITY): Admitting: Physical Therapy

## 2024-07-28 DIAGNOSIS — I972 Postmastectomy lymphedema syndrome: Secondary | ICD-10-CM

## 2024-07-28 NOTE — Therapy (Addendum)
 OUTPATIENT PHYSICAL THERAPY LYMPHEDEMA Treatment   Patient Name: Melissa Rich MRN: [id number] DOB:1951/04/24, 73 y.o., female Today's Date: 07/28/2024  END OF SESSION:  PT End of Session - 07/28/24 0900     Visit Number 3    Number of Visits 9    Date for Recertification  08/06/24    Authorization Type UHC    Authorization Time Period 10/3-10/24    Authorization - Visit Number 3    Authorization - Number of Visits 9    Progress Note Due on Visit 9    PT Start Time 0810    PT Stop Time 0901    PT Time Calculation (min) 51 min    Activity Tolerance Patient tolerated treatment well    Behavior During Therapy WFL for tasks assessed/performed            Past Medical History:  Diagnosis Date   Hypertension    Macular degeneration of both eyes    Medical history non-contributory    Past Surgical History:  Procedure Laterality Date   ABDOMINAL HYSTERECTOMY  1995   AXILLARY SENTINEL NODE BIOPSY Right 01/14/2024   Procedure: BIOPSY, LYMPH NODE, SENTINEL, AXILLARY;  Surgeon: Kallie Manuelita BROCKS, MD;  Location: [facility];  Service: General;  Laterality: Right;   BREAST BIOPSY Right 12/10/2023   MM RT BREAST BX W LOC DEV EA AD LESION IMG BX SPEC STEREO GUIDE 12/10/2023 [facility]   BREAST BIOPSY Right 12/10/2023   MM RT BREAST BX W LOC DEV 1ST LESION IMAGE BX SPEC STEREO GUIDE 12/10/2023 [facility]   COLONOSCOPY N/A 07/20/2013   Procedure: COLONOSCOPY;  Surgeon: Oneil DELENA Budge, MD;  Location: [facility];  Service: Gastroenterology;  Laterality: N/A;   COLONOSCOPY WITH PROPOFOL  N/A 07/17/2023   Procedure: COLONOSCOPY WITH PROPOFOL ;  Surgeon: Shaaron Lamar HERO, MD;  Location: [facility];  Service: Endoscopy;  Laterality: N/A;  915am, asa 2   POLYPECTOMY  07/17/2023   Procedure: POLYPECTOMY;  Surgeon: Shaaron Lamar HERO, MD;  Location: [facility];  Service: Endoscopy;;   TOTAL MASTECTOMY Right 01/14/2024   Procedure: MASTECTOMY, SIMPLE;  Surgeon: Kallie Manuelita BROCKS, MD;   Location: [facility];  Service: General;  Laterality: Right;   Patient Active Problem List   Diagnosis Date Noted   Lymphedema of right upper extremity 07/13/2024   Hyponatremia 01/16/2024   Postoperative anemia due to acute blood loss 01/16/2024   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 01/14/2024   Ductal carcinoma in situ (DCIS) of right breast 12/25/2023   Encounter for screening for other suspected endocrine disorder 04/09/2022   Long term current use of therapeutic drug 04/09/2022   Senile osteoporosis 04/09/2022   Macular cyst, hole, or pseudohole, right eye 03/18/2017   Mixed hyperlipidemia 03/18/2017   Vitamin D  deficiency 03/18/2017   Essential hypertension 01/07/2017   Lung field abnormal 01/07/2017   Simple obesity 01/07/2017   Other and unspecified ovarian cyst 03/24/2014    PCP: Benjamin Gather  REFERRING PROVIDER: Kallie Manuelita BROCKS, MD  REFERRING DIAG: I89.0 (ICD-10-CM) - Lymphedema of right upper extremity  THERAPY DIAG:  Postmastectomy lymphedema  Rationale for Evaluation and Treatment: Rehabilitation  ONSET DATE: 01/14/24  SUBJECTIVE:  SUBJECTIVE STATEMENT: Pt states that she has been doing her manual every day.  She is sore from the exercises.  PERTINENT HISTORY: Per MD      Oncology History  Ductal carcinoma in situ (DCIS) of right breast  12/02/2023 Mammogram    IMPRESSION: Indeterminate linear oriented 5.2 cm group of calcifications in the upper slightly inner right breast.    12/10/2023 Pathology Results    FINAL DIAGNOSIS    1. Breast, right, needle core biopsy, upper inner quadrant, posterior (x clip): DUCTAL CARCINOMA IN SITU, HIGH NUCLEAR GRADE  NECROSIS: PRESENT  CALCIFICATIONS: PRESENT  DCIS LENGTH: 0.4 CM       2. Breast,  right, needle core biopsy, upper inner quadrant, anterior (ribbon clip) :  DUCTAL CARCINOMA IN SITU, HIGH NUCLEAR GRADE  NECROSIS: PRESENT  CALCIFICATIONS: PRESENT  DCIS LENGTH: 0.2 CM       12/25/2023 Initial Diagnosis    Ductal carcinoma in situ (DCIS) of right breast    01/14/2024 Pathology Results    FINAL MICROSCOPIC DIAGNOSIS:  A. BREAST, RIGHT, MASTECTOMY: Ductal carcinoma in situ, solid type with comedonecrosis, nuclear grade 3 of 3 DCIS greatest dimension: 4.6 x 3.9 x 3.2 cm Margins: negative Closest margin: 0.8 cm anterior-superior Prognostic markers: ER negative, PR negative Biopsy site clips Other findings: N/A See oncology table  B. SENTINEL LYMPH NODE, RIGHT AXILLARY, EXCISION: -  One benign lymph node (0/1).  C. SENTINEL LYMPH NODE, RIGHT AXILLARY, EXCISION: -  One benign lymph node (0/1).  D. SENTINEL LYMPH NODE, RIGHT AXILLARY, EXCISION: -  One benign lymph node (0/1).  ONCOLOGY TABLE: Histologic Type: Ductal carcinoma in situ Size of DCIS: 4.6 x 3.9 x 3.2 cm Nuclear Grade: 3/3 Necrosis: Present Margins: All margins negative for DCIS Specify Closest Margin (required only if <15mm): 8 mm, anterior superior Regional Lymph Nodes:      Number of Lymph Nodes Examined: 3      Number of Lymph Nodes with Micrometastases): 0      Number of Lymph Nodes with Isolated Tumor Cells (=0.2 mm or =200 cells): 0      Extranodal Extension: N/A  Pathologic Stage Classification (pTNM, AJCC 8th Edition): pTis, pN0     01/14/2024 Cancer Staging    Staging form: Breast, AJCC 8th Edition - Clinical stage from 01/14/2024: Stage 0 (cTis (DCIS), cN0, cM0, G3, ER-, PR-, HER2: Not Assessed) - Signed by Davonna Siad, MD on 01/29/2024 Stage prefix: Initial diagnosis Histologic grading system: 3 grade system        PAIN:  Are you having pain? Yes NPRS scale: 1/10 Pain location: Right elbow Pain orientation: Right  PAIN TYPE: aching Pain description: intermittent  Aggravating  factors: morning  Relieving factors: movement   PRECAUTIONS: Other: cellulitis  RED FLAGS: None   WEIGHT BEARING RESTRICTIONS: No  FALLS:  Has patient fallen in last 6 months? No  LIVING ENVIRONMENT: Lives with: lives with their family Lives in: House/apartment  LEISURE: retired  HAND DOMINANCE: right   PRIOR LEVEL OF FUNCTION: Independent  PATIENT GOALS: keep the swelling down    OBJECTIVE: Note: Objective measures were completed at Evaluation unless otherwise noted.  COGNITION: Overall cognitive status: Within functional limits for tasks assessed   PALPATION: No induration   OBSERVATIONS / OTHER ASSESSMENTS: minimal swelling    UPPER EXTREMITY AROM/PROM:  All wnl    LYMPHEDEMA ASSESSMENTS:   SURGERY TYPE/DATE: 01/14/24 Rt mastectomy  NUMBER OF LYMPH NODES REMOVED: 1  CHEMOTHERAPY: none  RADIATION:none  HORMONE TREATMENT: none  LYMPHEDEMA ASSESSMENTS: Pt is right handed    LANDMARK RIGHT   eval 07/28/24  Axillary  35.9 34  10 cm proximal to olecranon process 30.3 30.3  Olecranon process 27 26.3  10 cm proximal to ulnar styloid process 22.2 21.8  Just proximal to ulnar styloid process 17.8 17.5  Across hand at thumb web space 19.8 19.3  At base of index 7.5 6.8  thumb 6.5 6.5  (Blank rows = not tested)  ARM length. subaxillary to wrist: 39.5; to MCP 47  LANDMARK LEFT   eval  Axillary 35.3  10 cm proximal to olecranon process 30.3  Olecranon process 26.2   10 cm proximal to ulnar styloid process 21.8  Just proximal to ulnar styloid process 17  Across hand at thumb web space 19.2  At base of index digit 7.3  thumb 6.3  (Blank rows = not tested)  TODAY'S TREATMENT:                                                                                                                              DATE:  07/28/24: measurement Manual completed to include supraclavicular, Lt axillary, Rt inguinal, RT to Lt axillary anastomosis,  rt axillary to Rt  inguinal anastomosis, both anterior and posterior followed by Rt UE both anterior and posterior. Therapist called [facility] with pt present as she has not heard from them.  No answer.  Left message to contact pt. PATIENT EDUCATION:  Education details: 07/20/24 self manual Person educated: Patient Education method: Explanation, Demonstration, Verbal cues, and Handouts Education comprehension: verbalized understanding and returned demonstration  HOME EXERCISE PROGRAM: 07/20/24:  self manual sheet  Access Code: [email or web address] URL: [email or web address] Date: 07/16/2024 Prepared by: Montie Metro  Exercises - Seated Diaphragmatic Breathing  - 1 x daily - 7 x weekly - 10 reps - 5 hold - Seated Cervical Sidebending AROM  - 1 x daily - 7 x weekly - 1 sets - 10 reps - 2-3 hold - Seated Cervical Rotation AROM  - 1 x daily - 7 x weekly - 1 sets - 10 reps - 2-3 hold - Seated Cervical Extension AROM  - 1 x daily - 7 x weekly - 1 sets - 10 reps - 2-3 hold - Seated Cervical Retraction  - 1 x daily - 7 x weekly - 1 sets - 10 reps - 2-3 hold - Shoulder Rolls in Sitting  - 1 x daily - 7 x weekly - 1 sets - 10 reps - 2-3 hold - Seated Sidebending Arms Overhead  - 1 x daily - 7 x weekly - 1 sets - 10 reps - 2-3 hold - Seated Shoulder Flexion Full Range  - 1 x daily - 7 x weekly - 1 sets - 10 reps - Seated Shoulder Horizontal Abduction - Thumbs Up  - 1 x daily - 7 x weekly - 1 sets - 10 reps - Seated Shoulder Abduction Full Range  - 1 x  daily - 7 x weekly - 1 sets - 10 reps - Seated Biceps Curl  - 1 x daily - 7 x weekly - 1 sets - 10 reps - Seated Single Arm Bicep Curls Supinated with Dumbbell  - 1 x daily - 7 x weekly - 1 sets - 10 reps - Seated Wrist Flexion Active Stretch Pronated with Elbow Straight  - 1 x daily - 7 x weekly - 1 sets - 10 reps - Hand AROM Flat Fist  - 1 x daily - 7 x weekly - 1 sets - 10 reps  ASSESSMENT:  CLINICAL IMPRESSION: noted reduction in measurement  of RT UE in almost all areas.  PT progressing well.  Answered questions on manual.  Pt will be out of town next week but will continue to complete her manual on her own.   OBJECTIVE IMPAIRMENTS: increased edema and impaired flexibility.   ACTIVITY LIMITATIONS: carrying and lifting  PARTICIPATION LIMITATIONS: cleaning and laundry  REHAB POTENTIAL: Good  CLINICAL DECISION MAKING: Evolving/moderate complexity  EVALUATION COMPLEXITY: Moderate  GOALS: Goals reviewed with patient? Yes  SHORT TERM GOALS: Target date: 10/15/125  Pt to be I in HEP to improve lymphatic circulation  Baseline: Goal status: met  2.  Pt  measurement to be decreased by one cm. Baseline:  Goal status: on-going     LONG TERM GOALS: Target date: 08/06/24  PT to be I in self manual techniques  Baseline:  Goal status: met  2.  Pt to have received and be donning her new compression sleeve.  Baseline:  Goal status: on-going   PLAN:  PT FREQUENCY: 3x/week  PT DURATION: 3 weeks  PLANNED INTERVENTIONS: 97110-Therapeutic exercises, 97535- Self Care, 02859- Manual therapy, and Patient/Family education  PLAN FOR NEXT SESSION: measure, continue manual techniques answer any questions pt has.  See if pt has heard from [facility].    Montie Metro, PT CLT [phone]  07/28/2024, 9:02 AM

## 2024-07-30 ENCOUNTER — Other Ambulatory Visit (HOSPITAL_COMMUNITY): Payer: Self-pay | Admitting: Oncology

## 2024-07-30 ENCOUNTER — Inpatient Hospital Stay: Attending: Oncology | Admitting: Oncology

## 2024-07-30 VITALS — BP 133/77 | HR 80 | Temp 98.1°F | Resp 16 | Wt 170.6 lb

## 2024-07-30 DIAGNOSIS — D0511 Intraductal carcinoma in situ of right breast: Secondary | ICD-10-CM | POA: Diagnosis not present

## 2024-07-30 DIAGNOSIS — Z9011 Acquired absence of right breast and nipple: Secondary | ICD-10-CM | POA: Diagnosis not present

## 2024-07-30 DIAGNOSIS — Z86000 Personal history of in-situ neoplasm of breast: Secondary | ICD-10-CM | POA: Insufficient documentation

## 2024-07-30 DIAGNOSIS — M7989 Other specified soft tissue disorders: Secondary | ICD-10-CM | POA: Diagnosis not present

## 2024-07-30 NOTE — Assessment & Plan Note (Addendum)
 High-risk DCIS due to lesion size >3 cm and grade 3.  Mastectomy performed 02/05/2024. No indication for endocrine therapy needed due to negative hormone receptors.  No indication for radiation since the patient had mastectomy.  Mastectomy reduces recurrence risk.  -Continue to follow-up with Dr. Kallie PRN -She was recently referred to the lymphedema clinic for right arm swelling.  Reports she will be getting a sleeve soon but it has not come in yet.  She is also getting a silicone free prosthesis. -Continue annual mammograms-next due 11/2024 -Will follow-up every 6 months 6 months for a breast exam and yearly mammograms in the setting of high risk for recurrence and breast cancer

## 2024-07-30 NOTE — Progress Notes (Signed)
 [facility] OFFICE PROGRESS NOTE  Nsumanganyi, [name], NP  ASSESSMENT & PLAN:    Assessment & Plan Ductal carcinoma in situ (DCIS) of right breast High-risk DCIS due to lesion size >3 cm and grade 3.  Mastectomy performed 02/05/2024. No indication for endocrine therapy needed due to negative hormone receptors.  No indication for radiation since the patient had mastectomy.  Mastectomy reduces recurrence risk.  -Continue to follow-up with Dr. Kallie PRN -She was recently referred to the lymphedema clinic for right arm swelling.  Reports she will be getting a sleeve soon but it has not come in yet.  She is also getting a silicone free prosthesis. -Continue annual mammograms-next due 11/2024 -Will follow-up every 6 months 6 months for a breast exam and yearly mammograms in the setting of high risk for recurrence and breast cancer  Orders Placed This Encounter  Procedures   MM DIAG BREAST TOMO UNI LEFT    Standing Status:   Future    Expected Date:   11/30/2024    Expiration Date:   07/30/2025    Reason for Exam (SYMPTOM  OR DIAGNOSIS REQUIRED):   Hx of breast cancer DCIS Right breast    Preferred imaging location?:   [facility]   Basic metabolic panel    Standing Status:   Future    Expected Date:   01/28/2025    Expiration Date:   07/30/2025   CBC    Standing Status:   Future    Expected Date:   01/28/2025    Expiration Date:   07/30/2025    INTERVAL HISTORY: Patient returns for follow-up for DCIS status postmastectomy completed on 02/05/2024.  She continues follow-up with Dr. Kallie.  Overall, tolerated surgery well.  She recently started to have right arm swelling and was referred to the lymphedema clinic.  She is having trouble with the prosthesis that was made for her due to an allergy.  Reports that she thinks she is allergic to silicone and will be getting a silicone free prosthesis in the near future.  She is also expecting a lymphedema sleeve  to help with swelling in her right arm.  She did not require chemo or radiation but does have high risk of DCIS.  ER/PR negative.  She will be due for her annual mammogram in February 2026.   Patient is a non-smoker, does not drink alcohol.  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  Has no family history of breast cancer.   SUMMARY OF HEMATOLOGIC HISTORY: Oncology History  Ductal carcinoma in situ (DCIS) of right breast  12/02/2023 Mammogram   IMPRESSION: Indeterminate linear oriented 5.2 cm group of calcifications in the upper slightly inner right breast.   12/10/2023 Pathology Results   FINAL DIAGNOSIS   1. Breast, right, needle core biopsy, upper inner quadrant, posterior (x clip): DUCTAL CARCINOMA IN SITU, HIGH NUCLEAR GRADE  NECROSIS: PRESENT  CALCIFICATIONS: PRESENT  DCIS LENGTH: 0.4 CM       2. Breast, right, needle core biopsy, upper inner quadrant, anterior (ribbon clip) :  DUCTAL CARCINOMA IN SITU, HIGH NUCLEAR GRADE  NECROSIS: PRESENT  CALCIFICATIONS: PRESENT  DCIS LENGTH: 0.2 CM     12/25/2023 Initial Diagnosis   Ductal carcinoma in situ (DCIS) of right breast   01/14/2024 Pathology Results   FINAL MICROSCOPIC DIAGNOSIS:  A. BREAST, RIGHT, MASTECTOMY: Ductal carcinoma in situ, solid type with comedonecrosis, nuclear grade 3 of 3 DCIS greatest dimension: 4.6 x 3.9 x 3.2  cm Margins: negative Closest margin: 0.8 cm anterior-superior Prognostic markers: ER negative, PR negative Biopsy site clips Other findings: N/A See oncology table  B. SENTINEL LYMPH NODE, RIGHT AXILLARY, EXCISION: -  One benign lymph node (0/1).  C. SENTINEL LYMPH NODE, RIGHT AXILLARY, EXCISION: -  One benign lymph node (0/1).  D. SENTINEL LYMPH NODE, RIGHT AXILLARY, EXCISION: -  One benign lymph node (0/1).  ONCOLOGY TABLE: Histologic Type: Ductal carcinoma in situ Size of DCIS: 4.6 x 3.9 x 3.2 cm Nuclear Grade: 3/3 Necrosis: Present Margins: All margins negative for DCIS Specify  Closest Margin (required only if <13mm): 8 mm, anterior superior Regional Lymph Nodes:      Number of Lymph Nodes Examined: 3      Number of Lymph Nodes with Micrometastases): 0      Number of Lymph Nodes with Isolated Tumor Cells (=0.2 mm or =200 cells): 0      Extranodal Extension: N/A  Pathologic Stage Classification (pTNM, AJCC 8th Edition): pTis, pN0    01/14/2024 Cancer Staging   Staging form: Breast, AJCC 8th Edition - Clinical stage from 01/14/2024: Stage 0 (cTis (DCIS), cN0, cM0, G3, ER-, PR-, HER2: Not Assessed) - Signed by Davonna Siad, MD on 01/29/2024 Stage prefix: Initial diagnosis Histologic grading system: 3 grade system      CBC    Component Value Date/Time   WBC 7.3 01/16/2024 0418   RBC 3.84 (L) 01/16/2024 0418   HGB 11.5 (L) 01/16/2024 0418   HCT 35.9 (L) 01/16/2024 0418   PLT 285 01/16/2024 0418   MCV 93.5 01/16/2024 0418   MCH 29.9 01/16/2024 0418   MCHC 32.0 01/16/2024 0418   RDW 12.9 01/16/2024 0418   LYMPHSABS 1.0 04/20/2015 1020   MONOABS 0.5 04/20/2015 1020   EOSABS 0.1 04/20/2015 1020   BASOSABS 0.0 04/20/2015 1020       Latest Ref Rng & Units 01/16/2024    4:18 AM 01/15/2024    4:16 AM 01/13/2024    9:52 AM  CMP  Glucose 70 - 99 mg/dL 94  859  95   BUN 8 - 23 mg/dL 19  14  11    Creatinine 0.44 - 1.00 mg/dL 9.05  9.16  9.23   Sodium 135 - 145 mmol/L 132  131  138   Potassium 3.5 - 5.1 mmol/L 3.7  3.4  3.5   Chloride 98 - 111 mmol/L 99  98  101   CO2 22 - 32 mmol/L 26  25  28    Calcium  8.9 - 10.3 mg/dL 8.3  7.8  9.3      No results found for: FERRITIN, VITAMINB12  Vitals:   07/30/24 0916  BP: 133/77  Pulse: 80  Resp: 16  Temp: 98.1 F (36.7 C)  SpO2: 100%    Review of System:  Review of Systems  Constitutional:  Positive for malaise/fatigue.  Neurological:  Negative for dizziness, weakness and headaches.    Physical Exam: Physical Exam Constitutional:      Appearance: Normal appearance.  HENT:     Head: Normocephalic  and atraumatic.  Eyes:     Pupils: Pupils are equal, round, and reactive to light.  Cardiovascular:     Rate and Rhythm: Normal rate and regular rhythm.     Heart sounds: Normal heart sounds. No murmur heard. Pulmonary:     Effort: Pulmonary effort is normal.     Breath sounds: Normal breath sounds. No wheezing.  Chest:  Breasts:    Right: Absent.  Comments: Breast exam showed right mastectomy without abnormality.  Left breast without mass or tenderness.  No axillary lymphadenopathy.  Mastectomy scar is nontender. Abdominal:     General: Bowel sounds are normal. There is no distension.     Palpations: Abdomen is soft.     Tenderness: There is no abdominal tenderness.  Musculoskeletal:        General: Normal range of motion.     Cervical back: Normal range of motion.  Skin:    General: Skin is warm and dry.     Findings: No rash.  Neurological:     Mental Status: She is alert and oriented to person, place, and time.     Gait: Gait is intact.  Psychiatric:        Mood and Affect: Mood and affect normal.        Cognition and Memory: Memory normal.        Judgment: Judgment normal.      I spent 20 minutes dedicated to the care of this patient (face-to-face and non-face-to-face) on the date of the encounter to include what is described in the assessment and plan.,  Delon Hope, NP 07/30/2024 9:44 AM

## 2024-08-03 ENCOUNTER — Encounter (HOSPITAL_COMMUNITY): Admitting: Physical Therapy

## 2024-08-05 ENCOUNTER — Encounter (HOSPITAL_COMMUNITY): Admitting: Physical Therapy

## 2024-08-10 ENCOUNTER — Ambulatory Visit (HOSPITAL_COMMUNITY): Admitting: Physical Therapy

## 2024-08-10 DIAGNOSIS — I972 Postmastectomy lymphedema syndrome: Secondary | ICD-10-CM | POA: Diagnosis not present

## 2024-08-10 NOTE — Therapy (Addendum)
 OUTPATIENT PHYSICAL THERAPY LYMPHEDEMA Discharge  Patient Name: Melissa Rich MRN: [id number] DOB:Dec 11, 1950, 73 y.o., female Today's Date: 08/10/2024 PHYSICAL THERAPY DISCHARGE SUMMARY  Visits from Start of Care: 4  Current functional level related to goals / functional outcomes: PT I in exercises, manual and has her compression sleeve.    Remaining deficits: None    Education / Equipment: Self manual, exercise and how to don/doff and care for compression garment   Patient agrees to discharge. Patient goals were met. Patient is being discharged due to meeting the stated rehab goals. 4 END OF SESSION:  PT End of Session - 08/10/24 0856     Visit Number 4    Number of Visits 4    Date for Recertification  08/06/24    Authorization Type [employer]    Authorization Time Period 10/3-10/24    Authorization - Visit Number 4    Authorization - Number of Visits 4    PT Start Time 0810    PT Stop Time 0853    PT Time Calculation (min) 43 min             Past Medical History:  Diagnosis Date   Hypertension    Macular degeneration of both eyes    Medical history non-contributory    Past Surgical History:  Procedure Laterality Date   ABDOMINAL HYSTERECTOMY  1995   AXILLARY SENTINEL NODE BIOPSY Right 01/14/2024   Procedure: BIOPSY, LYMPH NODE, SENTINEL, AXILLARY;  Surgeon: Kallie Manuelita BROCKS, MD;  Location: [facility];  Service: General;  Laterality: Right;   BREAST BIOPSY Right 12/10/2023   MM RT BREAST BX W LOC DEV EA AD LESION IMG BX SPEC STEREO GUIDE 12/10/2023 [facility]   BREAST BIOPSY Right 12/10/2023   MM RT BREAST BX W LOC DEV 1ST LESION IMAGE BX SPEC STEREO GUIDE 12/10/2023 [facility]   COLONOSCOPY N/A 07/20/2013   Procedure: COLONOSCOPY;  Surgeon: Oneil DELENA Budge, MD;  Location: [facility];  Service: Gastroenterology;  Laterality: N/A;   COLONOSCOPY WITH PROPOFOL  N/A 07/17/2023   Procedure: COLONOSCOPY WITH PROPOFOL ;  Surgeon: Shaaron Lamar HERO, MD;  Location:  [facility];  Service: Endoscopy;  Laterality: N/A;  915am, asa 2   POLYPECTOMY  07/17/2023   Procedure: POLYPECTOMY;  Surgeon: Shaaron Lamar HERO, MD;  Location: [facility];  Service: Endoscopy;;   TOTAL MASTECTOMY Right 01/14/2024   Procedure: MASTECTOMY, SIMPLE;  Surgeon: Kallie Manuelita BROCKS, MD;  Location: [facility];  Service: General;  Laterality: Right;   Patient Active Problem List   Diagnosis Date Noted   Lymphedema of right upper extremity 07/13/2024   Hyponatremia 01/16/2024   Postoperative anemia due to acute blood loss 01/16/2024   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 01/14/2024   Ductal carcinoma in situ (DCIS) of right breast 12/25/2023   Encounter for screening for other suspected endocrine disorder 04/09/2022   Long term current use of therapeutic drug 04/09/2022   Senile osteoporosis 04/09/2022   Macular cyst, hole, or pseudohole, right eye 03/18/2017   Mixed hyperlipidemia 03/18/2017   Vitamin D  deficiency 03/18/2017   Essential hypertension 01/07/2017   Lung field abnormal 01/07/2017   Simple obesity 01/07/2017   Other and unspecified ovarian cyst 03/24/2014    PCP: Benjamin Gather  REFERRING PROVIDER: Kallie Manuelita BROCKS, MD  REFERRING DIAG: I89.0 (ICD-10-CM) - Lymphedema of right upper extremity  THERAPY DIAG:  Postmastectomy lymphedema  Rationale for Evaluation and Treatment: Rehabilitation  ONSET DATE: 01/14/24  SUBJECTIVE:  SUBJECTIVE STATEMENT: Pt states that she received her compression garment yesterday.   PERTINENT HISTORY: Per MD      Oncology History  Ductal carcinoma in situ (DCIS) of right breast  12/02/2023 Mammogram    IMPRESSION: Indeterminate linear oriented 5.2 cm group of calcifications in the upper slightly inner right  breast.    12/10/2023 Pathology Results    FINAL DIAGNOSIS    1. Breast, right, needle core biopsy, upper inner quadrant, posterior (x clip): DUCTAL CARCINOMA IN SITU, HIGH NUCLEAR GRADE  NECROSIS: PRESENT  CALCIFICATIONS: PRESENT  DCIS LENGTH: 0.4 CM       2. Breast, right, needle core biopsy, upper inner quadrant, anterior (ribbon clip) :  DUCTAL CARCINOMA IN SITU, HIGH NUCLEAR GRADE  NECROSIS: PRESENT  CALCIFICATIONS: PRESENT  DCIS LENGTH: 0.2 CM       12/25/2023 Initial Diagnosis    Ductal carcinoma in situ (DCIS) of right breast    01/14/2024 Pathology Results    FINAL MICROSCOPIC DIAGNOSIS:  A. BREAST, RIGHT, MASTECTOMY: Ductal carcinoma in situ, solid type with comedonecrosis, nuclear grade 3 of 3 DCIS greatest dimension: 4.6 x 3.9 x 3.2 cm Margins: negative Closest margin: 0.8 cm anterior-superior Prognostic markers: ER negative, PR negative Biopsy site clips Other findings: N/A See oncology table  B. SENTINEL LYMPH NODE, RIGHT AXILLARY, EXCISION: -  One benign lymph node (0/1).  C. SENTINEL LYMPH NODE, RIGHT AXILLARY, EXCISION: -  One benign lymph node (0/1).  D. SENTINEL LYMPH NODE, RIGHT AXILLARY, EXCISION: -  One benign lymph node (0/1).  ONCOLOGY TABLE: Histologic Type: Ductal carcinoma in situ Size of DCIS: 4.6 x 3.9 x 3.2 cm Nuclear Grade: 3/3 Necrosis: Present Margins: All margins negative for DCIS Specify Closest Margin (required only if <81mm): 8 mm, anterior superior Regional Lymph Nodes:      Number of Lymph Nodes Examined: 3      Number of Lymph Nodes with Micrometastases): 0      Number of Lymph Nodes with Isolated Tumor Cells (=0.2 mm or =200 cells): 0      Extranodal Extension: N/A  Pathologic Stage Classification (pTNM, AJCC 8th Edition): pTis, pN0     01/14/2024 Cancer Staging    Staging form: Breast, AJCC 8th Edition - Clinical stage from 01/14/2024: Stage 0 (cTis (DCIS), cN0, cM0, G3, ER-, PR-, HER2: Not Assessed) - Signed by Davonna Siad, MD on 01/29/2024 Stage prefix: Initial diagnosis Histologic grading system: 3 grade system        PAIN:  Are you having pain? Yes NPRS scale: 1/10 Pain location: Right elbow Pain orientation: Right  PAIN TYPE: aching Pain description: intermittent  Aggravating factors: morning  Relieving factors: movement   PRECAUTIONS: Other: cellulitis  RED FLAGS: None   WEIGHT BEARING RESTRICTIONS: No  FALLS:  Has patient fallen in last 6 months? No  LIVING ENVIRONMENT: Lives with: lives with their family Lives in: House/apartment  LEISURE: retired  HAND DOMINANCE: right   PRIOR LEVEL OF FUNCTION: Independent  PATIENT GOALS: keep the swelling down    OBJECTIVE: Note: Objective measures were completed at Evaluation unless otherwise noted.  COGNITION: Overall cognitive status: Within functional limits for tasks assessed   PALPATION: No induration   OBSERVATIONS / OTHER ASSESSMENTS: minimal swelling    UPPER EXTREMITY AROM/PROM:  All wnl    LYMPHEDEMA ASSESSMENTS:   SURGERY TYPE/DATE: 01/14/24 Rt mastectomy  NUMBER OF LYMPH NODES REMOVED: 1  CHEMOTHERAPY: none  RADIATION:none  HORMONE TREATMENT: none   LYMPHEDEMA ASSESSMENTS: Pt is right handed  LANDMARK RIGHT   eval 07/28/24 08/10/24  Axillary  35.9 34 34  10 cm proximal to olecranon process 30.3 30.3 30.5  Olecranon process 27 26.3 25.8  10 cm proximal to ulnar styloid process 22.2 21.8 21.5  Just proximal to ulnar styloid process 17.8 17.5 17.2  Across hand at thumb web space 19.8 19.3 19  At base of index 7.5 6.8 6.7  thumb 6.5 6.5 6.2  (Blank rows = not tested)  ARM length. subaxillary to wrist: 39.5; to MCP 47  LANDMARK LEFT   eval  Axillary 35.3  10 cm proximal to olecranon process 30.3  Olecranon process 26.2   10 cm proximal to ulnar styloid process 21.8  Just proximal to ulnar styloid process 17  Across hand at thumb web space 19.2  At base of index digit 7.3  thumb 6.3   (Blank rows = not tested)  TODAY'S TREATMENT:                                                                                                                              DATE:  08/10/24: measurement Manual completed to include supraclavicular, Lt axillary, Rt inguinal, RT to Lt axillary anastomosis,  rt axillary to Rt inguinal anastomosis, both anterior and posterior followed by Rt UE both anterior and posterior. Instructions on donning, doffing and wearing of compression garment.   PATIENT EDUCATION:  Education details: 07/20/24 self manual Person educated: Patient Education method: Explanation, Demonstration, Verbal cues, and Handouts Education comprehension: verbalized understanding and returned demonstration  HOME EXERCISE PROGRAM: 07/20/24:  self manual sheet  Access Code: [email or web address] URL: [email or web address] Date: 07/16/2024 Prepared by: Montie Metro  Exercises - Seated Diaphragmatic Breathing  - 1 x daily - 7 x weekly - 10 reps - 5 hold - Seated Cervical Sidebending AROM  - 1 x daily - 7 x weekly - 1 sets - 10 reps - 2-3 hold - Seated Cervical Rotation AROM  - 1 x daily - 7 x weekly - 1 sets - 10 reps - 2-3 hold - Seated Cervical Extension AROM  - 1 x daily - 7 x weekly - 1 sets - 10 reps - 2-3 hold - Seated Cervical Retraction  - 1 x daily - 7 x weekly - 1 sets - 10 reps - 2-3 hold - Shoulder Rolls in Sitting  - 1 x daily - 7 x weekly - 1 sets - 10 reps - 2-3 hold - Seated Sidebending Arms Overhead  - 1 x daily - 7 x weekly - 1 sets - 10 reps - 2-3 hold - Seated Shoulder Flexion Full Range  - 1 x daily - 7 x weekly - 1 sets - 10 reps - Seated Shoulder Horizontal Abduction - Thumbs Up  - 1 x daily - 7 x weekly - 1 sets - 10 reps - Seated Shoulder Abduction Full Range  - 1 x daily - 7 x weekly - 1 sets - 10 reps -  Seated Biceps Curl  - 1 x daily - 7 x weekly - 1 sets - 10 reps - Seated Single Arm Bicep Curls Supinated with Dumbbell  - 1 x daily - 7 x  weekly - 1 sets - 10 reps - Seated Wrist Flexion Active Stretch Pronated with Elbow Straight  - 1 x daily - 7 x weekly - 1 sets - 10 reps - Hand AROM Flat Fist  - 1 x daily - 7 x weekly - 1 sets - 10 reps  ASSESSMENT:  CLINICAL IMPRESSION: noted reduction in measurement of RT UE in almost all areas. PT measurements are comparable to LT, pt is I in both manual and exercises and has her compression garment.  Pt is ready for discharge at this time.  OBJECTIVE IMPAIRMENTS: increased edema and impaired flexibility.   ACTIVITY LIMITATIONS: carrying and lifting  PARTICIPATION LIMITATIONS: cleaning and laundry  REHAB POTENTIAL: Good  CLINICAL DECISION MAKING: Evolving/moderate complexity  EVALUATION COMPLEXITY: Moderate  GOALS: Goals reviewed with patient? Yes  SHORT TERM GOALS: Target date: 10/15/125  Pt to be I in HEP to improve lymphatic circulation  Baseline: Goal status: met  2.  Pt  measurement to be decreased by one cm. Baseline:  Goal status: met    LONG TERM GOALS: Target date: 08/06/24  PT to be I in self manual techniques  Baseline:  Goal status: met  2.  Pt to have received and be donning her new compression sleeve.  Baseline:  Goal status: met   PLAN:  PT FREQUENCY: 3x/week  PT DURATION: 3 weeks  PLANNED INTERVENTIONS: 97110-Therapeutic exercises, 97535- Self Care, 02859- Manual therapy, and Patient/Family education  PLAN FOR NEXT SESSION:  discharge.   Montie Metro, PT CLT [phone]  08/10/2024, 8:58 AM    [employer] Medicare Auth Request Information Treatment Start Date: 07/16/24  currently out of cert date but pt is being discharged at this time with only using 4/9 Approved visists.   Date of referral: 07/13/24 Referring provider: Manuelita Pander  Referring diagnosis (ICD 10)? I89.0 (ICD-10-CM) - Lymphedema of right upper extremity Treatment diagnosis (ICD 10)? (if different than referring diagnosis) I97.2   What was this (referring dx) caused  by? Surgery (Type: mastectomy)   Lysle of Condition: Chronic (continuous duration > 3 months)              Laterality: Rt       Objective measurements identify impairments when they are compared to normal values, the uninvolved extremity, and prior level of function.             [x]  Yes             []  No   Objective assessment of functional ability: Minimal functional limitations              Briefly describe symptoms: noted increased swelling in her hand and forearm following surgery   How did symptoms start: slowly   Average pain intensity:             Last 24 hours: 1             Past week: 1   How often does the pt experience symptoms? Constantly   How much have the symptoms interfered with usual daily activities? A little bit   How has condition changed since care began at this facility? Improved pt has noted decreased swelling in UE.  PT now has arm sleeve.    In general, how is the patients overall health?  Good

## 2024-08-12 ENCOUNTER — Encounter (HOSPITAL_COMMUNITY): Admitting: Physical Therapy

## 2024-09-29 ENCOUNTER — Telehealth: Payer: Self-pay | Admitting: *Deleted

## 2024-09-29 NOTE — Telephone Encounter (Signed)
 Patient called to advise that she feels like she may have overdone her exercises for her lymphedema.  Has been experiencing discomfort in shoulders for 3 weeks.  States that it is improving.  Advised to alternate heat and ice to the affected area 2-3 times a day.  She will call next week if continues.

## 2024-09-29 NOTE — Telephone Encounter (Signed)
 Received call from patient [phone]~ telephone.   Patient reports that she has upper back pain and neck pain. States that she feels she may have pulled something doing her exercises for the lymphedema in her right arm.   Advised to contact either oncology or PCP for further recommendations.

## 2024-10-11 ENCOUNTER — Telehealth: Payer: Self-pay | Admitting: *Deleted

## 2024-10-11 NOTE — Telephone Encounter (Signed)
 Patient called to advise that pain in shoulders has improved since last conversation on 12/17 with no new complaints.

## 2024-11-10 ENCOUNTER — Other Ambulatory Visit (HOSPITAL_COMMUNITY): Payer: Self-pay | Admitting: Oncology

## 2024-11-10 DIAGNOSIS — Z1231 Encounter for screening mammogram for malignant neoplasm of breast: Secondary | ICD-10-CM

## 2024-11-22 ENCOUNTER — Ambulatory Visit (HOSPITAL_COMMUNITY)

## 2024-11-25 ENCOUNTER — Ambulatory Visit: Admitting: Obstetrics & Gynecology

## 2024-12-07 ENCOUNTER — Other Ambulatory Visit (HOSPITAL_COMMUNITY)

## 2024-12-07 ENCOUNTER — Encounter (HOSPITAL_COMMUNITY)

## 2025-01-21 ENCOUNTER — Inpatient Hospital Stay

## 2025-01-27 ENCOUNTER — Inpatient Hospital Stay: Admitting: Oncology

## 2025-01-28 ENCOUNTER — Inpatient Hospital Stay: Admitting: Oncology
